# Patient Record
Sex: Female | Born: 1990 | Race: White | Hispanic: No | Marital: Married | Smoking: Former smoker
Health system: Southern US, Community
[De-identification: ages and names within clinical notes are randomized; demographics above are authoritative.]

## PROBLEM LIST (undated history)

## (undated) DIAGNOSIS — G43909 Migraine, unspecified, not intractable, without status migrainosus: Secondary | ICD-10-CM

## (undated) HISTORY — DX: Migraine, unspecified, not intractable, without status migrainosus: G43.909

## (undated) HISTORY — PX: NO PAST SURGERIES: SHX2092

---
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
## 2011-02-15 LAB — GLUCOSE, CAPILLARY
Glucose-Capillary: 106 mg/dL — ABNORMAL HIGH (ref 70–99)
Glucose-Capillary: 117 mg/dL — ABNORMAL HIGH (ref 70–99)
Glucose-Capillary: 134 mg/dL — ABNORMAL HIGH (ref 70–99)
Glucose-Capillary: 138 mg/dL — ABNORMAL HIGH (ref 70–99)
Glucose-Capillary: 139 mg/dL — ABNORMAL HIGH (ref 70–99)
Glucose-Capillary: 140 mg/dL — ABNORMAL HIGH (ref 70–99)
Glucose-Capillary: 163 mg/dL — ABNORMAL HIGH (ref 70–99)
Glucose-Capillary: 89 mg/dL (ref 70–99)

## 2011-02-15 LAB — BASIC METABOLIC PANEL
Calcium: 8.3 mg/dL — ABNORMAL LOW (ref 8.4–10.5)
Creatinine, Ser: 0.73 mg/dL (ref 0.4–1.2)
GFR calc Af Amer: >60 mL/min (ref >60)
Potassium: 3.8 mEq/L (ref 3.5–5.1)

## 2011-02-15 LAB — URINE CULTURE

## 2011-02-15 LAB — DIFFERENTIAL
Basophils Absolute: 0 10*3/uL (ref 0.0–0.1)
Basophils Relative: 0 % (ref 0–1)
Eosinophils Relative: 0 % (ref 0–5)
Lymphs Abs: 0.9 10*3/uL (ref 0.7–4.0)
Neutro Abs: 20.3 10*3/uL — ABNORMAL HIGH (ref 1.7–7.7)
Neutrophils Relative %: 88 % — ABNORMAL HIGH (ref 43–77)
WBC Morphology: INCREASED

## 2011-02-15 LAB — CBC
HCT: 31.7 % — ABNORMAL LOW (ref 36.0–46.0)
HCT: 35.9 % — ABNORMAL LOW (ref 36.0–46.0)
Hemoglobin: 10.8 g/dL — ABNORMAL LOW (ref 12.0–15.0)
MCH: 30 pg (ref 26.0–34.0)
MCH: 30 pg (ref 26.0–34.0)
MCHC: 34.1 g/dL (ref 30.0–36.0)
MCHC: 34.6 g/dL (ref 30.0–36.0)
MCV: 86.7 fL (ref 78.0–100.0)
MCV: 87.5 fL (ref 78.0–100.0)
MCV: 87.9 fL (ref 78.0–100.0)
Platelets: 156 10*3/uL (ref 150–400)
Platelets: 186 10*3/uL (ref 150–400)
RBC: 4.14 MIL/uL (ref 3.87–5.11)
RDW: 13.4 % (ref 11.5–15.5)
RDW: 13.7 % (ref 11.5–15.5)
WBC: 13 10*3/uL — ABNORMAL HIGH (ref 4.0–10.5)
WBC: 7.4 10*3/uL (ref 4.0–10.5)

## 2011-02-15 LAB — LACTIC ACID, PLASMA: Lactic Acid, Venous: 1.7 mmol/L (ref 0.5–2.2)

## 2011-02-15 LAB — URINE MICROSCOPIC-ADD ON

## 2011-02-15 LAB — URINALYSIS, ROUTINE W REFLEX MICROSCOPIC
Glucose, UA: >1000 mg/dL — AB
Ketones, ur: NEGATIVE mg/dL
Leukocytes, UA: NEGATIVE
Specific Gravity, Urine: 1.036 — ABNORMAL HIGH (ref 1.005–1.030)
pH: 6.5 (ref 5.0–8.0)

## 2011-02-15 LAB — COMPREHENSIVE METABOLIC PANEL
Albumin: 3.2 g/dL — ABNORMAL LOW (ref 3.5–5.2)
BUN: 6 mg/dL (ref 6–23)
Calcium: 9 mg/dL (ref 8.4–10.5)
GFR calc non Af Amer: >60 mL/min (ref >60)
Glucose, Bld: 147 mg/dL — ABNORMAL HIGH (ref 70–99)
Potassium: 3.7 mEq/L (ref 3.5–5.1)
Sodium: 132 mEq/L — ABNORMAL LOW (ref 135–145)
Total Bilirubin: 0.6 mg/dL (ref 0.3–1.2)
Total Protein: 6.8 g/dL (ref 6.0–8.3)

## 2011-02-15 LAB — CULTURE, BLOOD (ROUTINE X 2): Culture: NO GROWTH

## 2011-02-15 LAB — PROCALCITONIN: Procalcitonin: 1.84 ng/mL

## 2012-02-18 ENCOUNTER — Emergency Department (HOSPITAL_COMMUNITY)
Admission: EM | Admit: 2012-02-18 | Discharge: 2012-02-18 | Disposition: A | Discharge status: Home / Self Care | Payer: Self-pay | Attending: Emergency Medicine | Admitting: Emergency Medicine

## 2012-02-18 ENCOUNTER — Encounter (HOSPITAL_COMMUNITY): Payer: Self-pay | Admitting: Emergency Medicine

## 2012-02-18 DIAGNOSIS — Z0389 Encounter for observation for other suspected diseases and conditions ruled out: Secondary | ICD-10-CM | POA: Insufficient documentation

## 2012-02-18 LAB — CBC
HCT: 40.4 % (ref 36.0–46.0)
Hemoglobin: 13.4 g/dL (ref 12.0–15.0)
MCH: 29.5 pg (ref 26.0–34.0)
MCV: 88.8 fL (ref 78.0–100.0)
RBC: 4.55 MIL/uL (ref 3.87–5.11)
WBC: 12.1 10*3/uL — ABNORMAL HIGH (ref 4.0–10.5)

## 2012-02-18 LAB — BASIC METABOLIC PANEL
BUN: 10 mg/dL (ref 6–23)
CO2: 27 mEq/L (ref 19–32)
Calcium: 9.5 mg/dL (ref 8.4–10.5)
GFR calc non Af Amer: >90 mL/min (ref >90)
Glucose, Bld: 120 mg/dL — ABNORMAL HIGH (ref 70–99)
Sodium: 139 mEq/L (ref 135–145)

## 2012-02-18 LAB — DIFFERENTIAL
Eosinophils Absolute: 0 10*3/uL (ref 0.0–0.7)
Eosinophils Relative: 0 % (ref 0–5)
Lymphocytes Relative: 20 % (ref 12–46)
Lymphs Abs: 2.4 10*3/uL (ref 0.7–4.0)
Monocytes Absolute: 1 10*3/uL (ref 0.1–1.0)
Monocytes Relative: 8 % (ref 3–12)

## 2012-02-18 NOTE — ED Notes (Signed)
No answer, unable to find, not in w/r, h/w triage or b/r.

## 2012-02-18 NOTE — ED Notes (Signed)
PT. REPORTS DIZZINESS "SHAKY" WITH SOB WHILE AT WORK TODAY , ALSO REPORTS MOUTH FELT NUMB WHILE EATING THIS EVENING , SPEECH CLEAR / NO FACIAL ASYMMETRY , AMBULATORY.

## 2012-02-18 NOTE — ED Notes (Signed)
Pt. Called in main lobby and triage for vital signs update w/ no answer.

## 2019-01-29 ENCOUNTER — Encounter (HOSPITAL_COMMUNITY): Payer: Self-pay | Admitting: Emergency Medicine

## 2019-01-29 ENCOUNTER — Emergency Department (HOSPITAL_COMMUNITY): Payer: PRIVATE HEALTH INSURANCE

## 2019-01-29 ENCOUNTER — Emergency Department (HOSPITAL_COMMUNITY)
Admission: EM | Admit: 2019-01-29 | Discharge: 2019-01-29 | Disposition: A | Discharge status: Home / Self Care | Payer: PRIVATE HEALTH INSURANCE | Attending: Emergency Medicine | Admitting: Emergency Medicine

## 2019-01-29 ENCOUNTER — Other Ambulatory Visit: Payer: Self-pay

## 2019-01-29 DIAGNOSIS — N10 Acute tubulo-interstitial nephritis: Secondary | ICD-10-CM | POA: Diagnosis not present

## 2019-01-29 DIAGNOSIS — E860 Dehydration: Secondary | ICD-10-CM | POA: Insufficient documentation

## 2019-01-29 DIAGNOSIS — E119 Type 2 diabetes mellitus without complications: Secondary | ICD-10-CM | POA: Diagnosis not present

## 2019-01-29 DIAGNOSIS — J45909 Unspecified asthma, uncomplicated: Secondary | ICD-10-CM | POA: Insufficient documentation

## 2019-01-29 DIAGNOSIS — Z794 Long term (current) use of insulin: Secondary | ICD-10-CM | POA: Diagnosis not present

## 2019-01-29 DIAGNOSIS — R109 Unspecified abdominal pain: Secondary | ICD-10-CM | POA: Diagnosis present

## 2019-01-29 DIAGNOSIS — N12 Tubulo-interstitial nephritis, not specified as acute or chronic: Secondary | ICD-10-CM

## 2019-01-29 LAB — URINALYSIS, ROUTINE W REFLEX MICROSCOPIC
Bilirubin Urine: NEGATIVE
Glucose, UA: >=500 mg/dL — AB
HGB URINE DIPSTICK: NEGATIVE
Ketones, ur: 20 mg/dL — AB
NITRITE: NEGATIVE
Protein, ur: NEGATIVE mg/dL
Specific Gravity, Urine: 1.032 — ABNORMAL HIGH (ref 1.005–1.030)
pH: 5 (ref 5.0–8.0)

## 2019-01-29 LAB — COMPREHENSIVE METABOLIC PANEL
ALT: 15 U/L (ref 0–44)
ANION GAP: 11 (ref 5–15)
AST: 15 U/L (ref 15–41)
Albumin: 4.1 g/dL (ref 3.5–5.0)
Alkaline Phosphatase: 96 U/L (ref 38–126)
BILIRUBIN TOTAL: 1.6 mg/dL — AB (ref 0.3–1.2)
BUN: 8 mg/dL (ref 6–20)
CO2: 25 mmol/L (ref 22–32)
Calcium: 9.2 mg/dL (ref 8.9–10.3)
Chloride: 100 mmol/L (ref 98–111)
Creatinine, Ser: 0.74 mg/dL (ref 0.44–1.00)
GFR calc Af Amer: >60 mL/min (ref >60)
Glucose, Bld: 295 mg/dL — ABNORMAL HIGH (ref 70–99)
POTASSIUM: 4 mmol/L (ref 3.5–5.1)
Sodium: 136 mmol/L (ref 135–145)
TOTAL PROTEIN: 7.3 g/dL (ref 6.5–8.1)

## 2019-01-29 LAB — POC URINE PREG, ED: Preg Test, Ur: NEGATIVE

## 2019-01-29 LAB — CBC WITH DIFFERENTIAL/PLATELET
ABS IMMATURE GRANULOCYTES: 0.05 10*3/uL (ref 0.00–0.07)
Basophils Absolute: 0 10*3/uL (ref 0.0–0.1)
Basophils Relative: 0 %
Eosinophils Absolute: 0 10*3/uL (ref 0.0–0.5)
Eosinophils Relative: 0 %
HCT: 47.5 % — ABNORMAL HIGH (ref 36.0–46.0)
Hemoglobin: 15.2 g/dL — ABNORMAL HIGH (ref 12.0–15.0)
Immature Granulocytes: 0 %
Lymphocytes Relative: 9 %
Lymphs Abs: 1.1 10*3/uL (ref 0.7–4.0)
MCH: 29 pg (ref 26.0–34.0)
MCHC: 32 g/dL (ref 30.0–36.0)
MCV: 90.6 fL (ref 80.0–100.0)
Monocytes Absolute: 1 10*3/uL (ref 0.1–1.0)
Monocytes Relative: 8 %
NEUTROS ABS: 10.1 10*3/uL — AB (ref 1.7–7.7)
NEUTROS PCT: 83 %
NRBC: 0 % (ref 0.0–0.2)
Platelets: 222 10*3/uL (ref 150–400)
RBC: 5.24 MIL/uL — ABNORMAL HIGH (ref 3.87–5.11)
RDW: 11.5 % (ref 11.5–15.5)
WBC: 12.3 10*3/uL — ABNORMAL HIGH (ref 4.0–10.5)

## 2019-01-29 LAB — LIPASE, BLOOD: Lipase: 23 U/L (ref 11–51)

## 2019-01-29 MED ORDER — SODIUM CHLORIDE 0.9 % IV BOLUS
1000.0000 mL | Freq: Once | INTRAVENOUS | Status: AC
  Administered 2019-01-29: 1000 mL via INTRAVENOUS

## 2019-01-29 MED ORDER — SODIUM CHLORIDE 0.9 % IV SOLN
1.0000 g | Freq: Once | INTRAVENOUS | Status: AC
  Administered 2019-01-29: 1 g via INTRAVENOUS
  Filled 2019-01-29: qty 10

## 2019-01-29 MED ORDER — CEPHALEXIN 500 MG PO CAPS: 500.0000 mg | ORAL_CAPSULE | Freq: Two times a day (BID) | ORAL | 0 refills | Status: AC

## 2019-01-29 MED ORDER — ONDANSETRON HCL 4 MG/2ML IJ SOLN
4.0000 mg | Freq: Once | INTRAMUSCULAR | Status: AC | PRN
Start: 2019-01-29 — End: 2019-01-29
  Administered 2019-01-29: 4 mg via INTRAVENOUS
  Filled 2019-01-29: qty 2

## 2019-01-29 MED ORDER — HYDROCODONE-ACETAMINOPHEN 5-325 MG PO TABS: 1.0000 | ORAL_TABLET | ORAL | 0 refills | Status: DC | PRN

## 2019-01-29 MED ORDER — IOHEXOL 300 MG/ML  SOLN
100.0000 mL | Freq: Once | INTRAMUSCULAR | Status: AC | PRN
  Administered 2019-01-29: 100 mL via INTRAVENOUS

## 2019-01-29 MED ORDER — MORPHINE SULFATE (PF) 4 MG/ML IV SOLN
6.0000 mg | Freq: Once | INTRAVENOUS | Status: AC
  Administered 2019-01-29: 6 mg via INTRAVENOUS
  Filled 2019-01-29: qty 2

## 2019-01-29 NOTE — ED Notes (Signed)
Pt transported to CT ?

## 2019-01-29 NOTE — ED Notes (Signed)
Pt transported to US

## 2019-01-29 NOTE — ED Notes (Signed)
Pt. Aware that urine is needed, unable to provide any at this time.  

## 2019-01-29 NOTE — ED Triage Notes (Signed)
Pt reports r side 7/10 flank pain, n/v since 4am this morning. [facility] and was told her urine was negative.

## 2019-01-29 NOTE — Discharge Instructions (Addendum)
If you develop high fever, vomiting, uncontrolled pain, or any other new/concerning symptoms then return to the ER for evaluation.

## 2019-01-29 NOTE — ED Notes (Addendum)
"  Patient verbalizes understanding of discharge instructions. Opportunity for questioning and answers were provided.  pt discharged from ED ."  

## 2019-01-29 NOTE — ED Provider Notes (Signed)
[facility] EMERGENCY DEPARTMENT Provider Note   CSN: [id number] Arrival date & time: 01/29/19  1029    History   Chief Complaint Chief Complaint  Patient presents with  . Flank Pain  . Nausea  . Emesis    HPI Margaret Conner is a 28 y.o. female.     HPI  28 year old female presents with right-sided abdominal/flank pain.  Has been ongoing for a couple days but has not been that bad and seems to come and go.  Does seem somewhat food related but also positional.  Taking a deep breath makes it worse.  There is no cough or shortness of breath.  Last night/middle the morning, she noticed the pain is significantly worse.  She took ibuprofen which did not seem to help.  She had a temperature of 101.  She vomited 3 or 4 times.  No blood in her emesis.  She states she is had a little bit of urinary frequency but no dysuria or hematuria.  She went to urgent care and was told her urine looked okay.  Pain is currently rated as a 7.  Past Medical History:  Diagnosis Date  . Asthma   . Diabetes mellitus     There are no active problems to display for this patient.   History reviewed. No pertinent surgical history.   OB History   No obstetric history on file.      Home Medications    Prior to Admission medications   Medication Sig Start Date End Date Taking? Authorizing Provider  ibuprofen (ADVIL,MOTRIN) 200 MG tablet Take 600 mg by mouth every 6 (six) hours as needed for moderate pain.   Yes [provider]  Insulin Human (INSULIN PUMP) SOLN Inject 1 each into the skin continuous. Using Novolog   Yes [provider]  cephALEXin (KEFLEX) 500 MG capsule Take 1 capsule (500 mg total) by mouth 2 (two) times daily for 14 days. 01/29/19 02/12/19  Pricilla Loveless, MD  HYDROcodone-acetaminophen (NORCO) 5-325 MG tablet Take 1 tablet by mouth every 4 (four) hours as needed for severe pain. 01/29/19   Pricilla Loveless, MD    Family History No family  history on file.  Social History Social History   Tobacco Use  . Smoking status: Never Smoker  . Smokeless tobacco: Never Used  Substance Use Topics  . Alcohol use: Yes  . Drug use: No     Allergies   Penicillins   Review of Systems Review of Systems  Constitutional: Positive for fever.  Respiratory: Negative for cough and shortness of breath.   Gastrointestinal: Positive for abdominal pain, nausea and vomiting.  Genitourinary: Positive for flank pain. Negative for dysuria and hematuria.  Musculoskeletal: Positive for back pain.  All other systems reviewed and are negative.    Physical Exam Updated Vital Signs BP 127/82   Pulse 81   Temp 97.6 F (36.4 C) (Oral)   Resp 20   Ht 5\' 6"  (1.676 m)   Wt 67.1 kg   LMP 01/13/2019   SpO2 100%   BMI 23.89 kg/m   Physical Exam Vitals signs and nursing note reviewed.  Constitutional:      General: She is not in acute distress.    Appearance: She is well-developed. She is not ill-appearing or diaphoretic.  HENT:     Head: Normocephalic and atraumatic.     Right Ear: External ear normal.     Left Ear: External ear normal.     Nose: Nose normal.  Eyes:     General:        Right eye: No discharge.        Left eye: No discharge.  Cardiovascular:     Rate and Rhythm: Normal rate and regular rhythm.     Heart sounds: Normal heart sounds.  Pulmonary:     Effort: Pulmonary effort is normal.     Breath sounds: Normal breath sounds.  Abdominal:     Palpations: Abdomen is soft.     Tenderness: There is abdominal tenderness (most tender in RUQ) in the right upper quadrant, right lower quadrant and epigastric area. There is right CVA tenderness.  Skin:    General: Skin is warm and dry.  Neurological:     Mental Status: She is alert.  Psychiatric:        Mood and Affect: Mood is not anxious.      ED Treatments / Results  Labs (all labs ordered are listed, but only abnormal results are displayed) Labs Reviewed    COMPREHENSIVE METABOLIC PANEL - Abnormal; Notable for the following components:      Result Value   Glucose, Bld 295 (*)    Total Bilirubin 1.6 (*)    All other components within normal limits  CBC WITH DIFFERENTIAL/PLATELET - Abnormal; Notable for the following components:   WBC 12.3 (*)    RBC 5.24 (*)    Hemoglobin 15.2 (*)    HCT 47.5 (*)    Neutro Abs 10.1 (*)    All other components within normal limits  URINALYSIS, ROUTINE W REFLEX MICROSCOPIC - Abnormal; Notable for the following components:   APPearance CLOUDY (*)    Specific Gravity, Urine 1.032 (*)    Glucose, UA >=500 (*)    Ketones, ur 20 (*)    Leukocytes,Ua TRACE (*)    Bacteria, UA MANY (*)    All other components within normal limits  URINE CULTURE  LIPASE, BLOOD  POC URINE PREG, ED    EKG None  Radiology Ct Abdomen Pelvis W Contrast  Result Date: 01/29/2019 CLINICAL DATA:  Pt reports r side flank pain, n/v since 4am this morning. [facility] and [facility] told her [facility] was negative. EXAM: CT ABDOMEN AND PELVIS WITH CONTRAST TECHNIQUE: Multidetector CT imaging of the abdomen and pelvis was performed using the standard protocol following bolus administration of intravenous contrast. CONTRAST:  OMNIPAQUE IOHEXOL 300 MG/ML  SOLN COMPARISON:  02/10/2011 FINDINGS: Lower chest: No acute abnormality. Hepatobiliary: No focal liver abnormality is seen. No gallstones, gallbladder wall thickening, or biliary dilatation. Pancreas: Unremarkable. No pancreatic ductal dilatation or surrounding inflammatory changes. Spleen: Normal in size without focal abnormality. Adrenals/Urinary Tract: Normal adrenals Poorly marginated wedge-shaped area of decreased enhancement in the upper pole right kidney. Left kidney unremarkable. No hydronephrosis. No evident urolithiasis. Urinary bladder is incompletely distended, unremarkable. Stomach/Bowel: Stomach is nondilated. Small bowel is nondistended. Appendix not  discretely identified. No pericecal inflammatory/edematous change. Nonspecific 10mm metallic density projects in the cecum. The colon is nondilated, unremarkable. Vascular/Lymphatic: Portal vein patent. Retroaortic left renal vein, an anatomic variant. No arterial pathology identified. No abdominal or pelvic adenopathy. Reproductive: Uterus and bilateral adnexa are unremarkable. Other: No ascites. No free air. Musculoskeletal: No acute or significant osseous findings. IMPRESSION: 1. Wedge-shaped area of decreased enhancement in the upper pole right kidney suggesting focal pyelonephritis. Correlate with UA. 2. No urolithiasis or hydronephrosis. Electronically Signed   By: Corlis Leak M.D.   On: 01/29/2019 13:27  [place] Abdomen Limited Ruq  Result Date: 01/29/2019 CLINICAL DATA:  Right upper quadrant pain for 2 days. EXAM: ULTRASOUND ABDOMEN LIMITED RIGHT UPPER QUADRANT COMPARISON:  None. FINDINGS: Gallbladder: No gallstones or wall thickening visualized. No sonographic Murphy sign noted by sonographer. Common bile duct: Diameter: 2 mm, within normal limits. Liver: No focal lesion identified. Within normal limits in parenchymal echogenicity. Portal vein is patent on color Doppler imaging with normal direction of blood flow towards the liver. IMPRESSION: Negative.  No hepatobiliary abnormality identified. Electronically Signed   By: Myles RosenthalJohn  Stahl M.D.   On: 01/29/2019 11:52    Procedures Procedures (including critical care time)  Medications Ordered in ED Medications  sodium chloride 0.9 % bolus 1,000 mL (0 mLs Intravenous Stopped 01/29/19 1238)  morphine 4 MG/ML injection 6 mg (6 mg Intravenous Given 01/29/19 1100)  ondansetron (ZOFRAN) injection 4 mg (4 mg Intravenous Given 01/29/19 1100)  morphine 4 MG/ML injection 6 mg (6 mg Intravenous Given 01/29/19 1316)  iohexol (OMNIPAQUE) 300 MG/ML solution 100 mL (100 mLs Intravenous Contrast Given 01/29/19 1257)  cefTRIAXone (ROCEPHIN) 1 g in sodium chloride 0.9 % 100 mL  IVPB (1 g Intravenous New Bag/Given 01/29/19 1419)     Initial Impression / Assessment and Plan / ED Course  I have reviewed the triage vital signs and the nursing notes.  Pertinent labs & imaging results that were available during my care of the patient were reviewed by me and considered in my medical decision making (see chart for details).        Given the right upper quadrant/right flank pain, right upper quadrant ultrasound obtained but does not show gallbladder pathology.  Thus CT obtained to help rule out appendicitis.  They cannot see her appendix but there is no obvious inflammatory changes in that area.  However there is findings concerning for pyelonephritis.  She does have some urinary urgency and I think this is the most likely cause.  I did discuss with patient and mom the limitations of the CT and we discussed return precautions.  She was given dose of IV Rocephin.  She is never had a cephalosporin before but I think is reasonable to trial it and given no complications currently she can be discharged home with Keflex.  We discussed return precautions.  She otherwise appears stable.  Final Clinical Impressions(s) / ED Diagnoses   Final diagnoses:  Right sided abdominal pain  Pyelonephritis    ED Discharge Orders         Ordered    cephALEXin (KEFLEX) 500 MG capsule  2 times daily     01/29/19 1529    HYDROcodone-acetaminophen (NORCO) 5-325 MG tablet  Every 4 hours PRN     01/29/19 1529           Pricilla LovelessGoldston, Ilaisaane Marts, MD 01/29/19 1541

## 2019-01-31 LAB — URINE CULTURE

## 2019-02-01 ENCOUNTER — Telehealth: Payer: Self-pay | Admitting: *Deleted

## 2019-02-01 ENCOUNTER — Ambulatory Visit: Payer: PRIVATE HEALTH INSURANCE | Attending: Family Medicine | Admitting: Family Medicine

## 2019-02-01 ENCOUNTER — Encounter: Payer: Self-pay | Admitting: Family Medicine

## 2019-02-01 VITALS — BP 146/90 | HR 77 | Temp 98.1°F | Wt 149.0 lb

## 2019-02-01 DIAGNOSIS — N12 Tubulo-interstitial nephritis, not specified as acute or chronic: Secondary | ICD-10-CM

## 2019-02-01 DIAGNOSIS — R030 Elevated blood-pressure reading, without diagnosis of hypertension: Secondary | ICD-10-CM | POA: Diagnosis not present

## 2019-02-01 DIAGNOSIS — E1065 Type 1 diabetes mellitus with hyperglycemia: Secondary | ICD-10-CM

## 2019-02-01 DIAGNOSIS — E109 Type 1 diabetes mellitus without complications: Secondary | ICD-10-CM | POA: Insufficient documentation

## 2019-02-01 LAB — POCT GLYCOSYLATED HEMOGLOBIN (HGB A1C): HbA1c, POC (controlled diabetic range): 9.6 % — AB (ref 0.0–7.0)

## 2019-02-01 LAB — GLUCOSE, POCT (MANUAL RESULT ENTRY): POC Glucose: 304 mg/dl — AB (ref 70–99)

## 2019-02-01 NOTE — Progress Notes (Signed)
Subjective:  Patient ID: Margaret Conner, female    DOB: 03/23/1991  Age: 28 y.o. MRN: [id number]  CC: Hospitalization Follow-up and Abdominal Pain   HPI Margaret Conner is a 28 year old female with a history of type 1 diabetes mellitus (not under the care of a clinician for the last 2 years) who presents today to establish care. She had an ED visit 2 days ago where she was diagnosed with pyelonephritis.  She was treated with antiemetics, morphine, IV ceftriaxone and IV fluids in the ED. CT abdomen and pelvis with contrast revealed: IMPRESSION: 1. Wedge-shaped area of decreased enhancement in the upper pole right kidney suggesting focal pyelonephritis. Correlate with UA. 2. No urolithiasis or hydronephrosis. Urine culture was positive for Klebsiella pneumonia sensitive to Keflex which she is currently taking.  Her right flank pain has improved but she does have persisting abdominal pain on the right side which prevents her from standing at her job.  She works as a Research scientist (life sciences) and is requiring a note to be excused for today.  Denies fever, nausea or vomiting, dysuria.  With regards to her diabetes mellitus she has been obtaining Novolin R over-the-counter at [facility] for her insulin pump and has not been to see her endocrinologist in 2 years. Her blood pressure is elevated and she has no known history of hypertension.  Past Medical History:  Diagnosis Date  . Asthma   . Diabetes mellitus     History reviewed. No pertinent surgical history.  History reviewed. No pertinent family history.  Allergies  Allergen Reactions  . Penicillins Swelling    Did it involve swelling of the face/tongue/throat, SOB, or low BP? Yes Did it involve sudden or severe rash/hives, skin peeling, or any reaction on the inside of your mouth or nose? Yes Did you need to seek medical attention at a hospital or doctor's office? Yes When did it last happen?23 years ago If all above answers are  "NO", may proceed with cephalosporin use.    Outpatient Medications Prior to Visit  Medication Sig Dispense Refill  . cephALEXin (KEFLEX) 500 MG capsule Take 1 capsule (500 mg total) by mouth 2 (two) times daily for 14 days. 28 capsule 0  . HYDROcodone-acetaminophen (NORCO) 5-325 MG tablet Take 1 tablet by mouth every 4 (four) hours as needed for severe pain. 15 tablet 0  . Insulin Human (INSULIN PUMP) SOLN Inject 1 each into the skin continuous. Using Novolog    . ibuprofen (ADVIL,MOTRIN) 200 MG tablet Take 600 mg by mouth every 6 (six) hours as needed for moderate pain.     No facility-administered medications prior to visit.      ROS Review of Systems  Constitutional: Negative for activity change, appetite change and fatigue.  HENT: Negative for congestion, sinus pressure and sore throat.   Eyes: Negative for visual disturbance.  Respiratory: Negative for cough, chest tightness, shortness of breath and wheezing.   Cardiovascular: Negative for chest pain and palpitations.  Gastrointestinal: Positive for abdominal pain. Negative for abdominal distention and constipation.  Endocrine: Negative for polydipsia.  Genitourinary: Negative for dysuria and frequency.  Musculoskeletal: Negative for arthralgias and back pain.  Skin: Negative for rash.  Neurological: Negative for tremors, light-headedness and numbness.  Hematological: Does not bruise/bleed easily.  Psychiatric/Behavioral: Negative for agitation and behavioral problems.    Objective:  BP (!) 146/90   Pulse 77   Temp 98.1 F (36.7 C) (Oral)   Wt 149 lb (67.6 kg)   LMP 01/13/2019  SpO2 98%   BMI 24.05 kg/m   BP/Weight 02/01/2019 01/29/2019 02/18/2012  Systolic BP 146 127 120  Diastolic BP 90 82 72  Wt. (Lbs) 149 148 -  BMI 24.05 23.89 -      Physical Exam Constitutional:      Appearance: She is well-developed.  Cardiovascular:     Rate and Rhythm: Normal rate.     Heart sounds: Normal heart sounds. No murmur.    Pulmonary:     Effort: Pulmonary effort is normal.     Breath sounds: Normal breath sounds. No wheezing or rales.  Chest:     Chest wall: No tenderness.  Abdominal:     General: Bowel sounds are normal. There is no distension.     Palpations: Abdomen is soft. There is no mass.     Tenderness: There is no abdominal tenderness.     Comments: Right-sided abdominal TTP  Musculoskeletal: Normal range of motion.  Neurological:     Mental Status: She is alert and oriented to person, place, and time.     CMP Latest Ref Rng & Units 01/29/2019 02/18/2012 05/23/2010  Glucose 70 - 99 mg/dL 401(U) 272(Z) 366(Y)  BUN 6 - 20 mg/dL 8 10 6   Creatinine 0.44 - 1.00 mg/dL 4.03 4.74 2.59  Sodium 135 - 145 mmol/L 136 139 134(L)  Potassium 3.5 - 5.1 mmol/L 4.0 3.2(L) 3.8  Chloride 98 - 111 mmol/L 100 100 101  CO2 22 - 32 mmol/L 25 27 24   Calcium 8.9 - 10.3 mg/dL 9.2 9.5 5.6(L)  Total Protein 6.5 - 8.1 g/dL 7.3 - -  Total Bilirubin 0.3 - 1.2 mg/dL 8.7(F) - -  Alkaline Phos 38 - 126 U/L 96 - -  AST 15 - 41 U/L 15 - -  ALT 0 - 44 U/L 15 - -    Lipid Panel  No results found for: CHOL, TRIG, HDL, CHOLHDL, VLDL, LDLCALC, LDLDIRECT  CBC    Component Value Date/Time   WBC 12.3 (H) 01/29/2019 1041   RBC 5.24 (H) 01/29/2019 1041   HGB 15.2 (H) 01/29/2019 1041   HCT 47.5 (H) 01/29/2019 1041   PLT 222 01/29/2019 1041   MCV 90.6 01/29/2019 1041   MCH 29.0 01/29/2019 1041   MCHC 32.0 01/29/2019 1041   RDW 11.5 01/29/2019 1041   LYMPHSABS 1.1 01/29/2019 1041   MONOABS 1.0 01/29/2019 1041   EOSABS 0.0 01/29/2019 1041   BASOSABS 0.0 01/29/2019 1041    Lab Results  Component Value Date   HGBA1C 9.6 (A) 02/01/2019    Assessment & Plan:   1. Type 1 diabetes mellitus with hyperglycemia (HCC) Uncontrolled with A1c of 9.6 She has not been under medical care for the last 2 years Refer to endocrine Diabetic diet, lifestyle modifications - POCT glucose (manual entry) - POCT glycosylated hemoglobin (Hb  A1C) - Ambulatory referral to Endocrinology  2. Pyelonephritis Currently on Keflex; received ceftriaxone in the ED Symptoms are improving-advised she will need to allow some time for total recovery and improvement especially given abdominal pain. Increase fluid intake Discussed alarming signs including nausea, vomiting, worsening fever, worsening abdominal pain and she knows to reach out to the clinic or go to the ED if this develops.  3. Elevated blood pressure reading No known history of hypertension Will reassess blood pressure at next visit   No orders of the defined types were placed in this encounter.   Follow-up: Return in about 1 month (around 03/04/2019) for Follow-up on elevated blood pressure.  Hoy Register, MD, FAAFP. [facility] [place], [place] [phone]   02/01/2019, 5:30 PM

## 2019-02-01 NOTE — Telephone Encounter (Signed)
Post ED Visit - Positive Culture Follow-up  Culture report reviewed by antimicrobial stewardship pharmacist: [facility] []  Enzo Bi, Pharm.D. []  Celedonio Miyamoto, Pharm.D., BCPS AQ-ID []  Garvin Fila, Pharm.D., BCPS []  Georgina Pillion, Pharm.D., BCPS []  [place], [address].D., BCPS, AAHIVP []  Estella Husk, Pharm.D., BCPS, AAHIVP []  [name], PharmD, BCPS []  Phillips Climes, PharmD, BCPS []  Agapito Games, PharmD, BCPS []  Verlan Friends, PharmD []  Mervyn Gay, PharmD, BCPS []  Vinnie Level, PharmD Wendelyn Breslow, PharmD  [facility] []  Len Childs, PharmD []  Greer Pickerel, PharmD []  Adalberto Cole, PharmD []  Perlie Gold, Rph []  Lonell Face) Jean Rosenthal, PharmD []  Earl Many, PharmD []  Junita Push, PharmD []  Dorna Leitz, PharmD []  Terrilee Files, PharmD []  Lynann Beaver, PharmD []  Keturah Barre, PharmD []  Loralee Pacas, PharmD []  Bernadene Person, PharmD   Positive urine culture Treated with Cephalexin, organism sensitive to the same and no further patient follow-up is required at this time.  [facility]irl [facility] 02/01/2019, 9:00 AM

## 2019-02-01 NOTE — Progress Notes (Signed)
C/C: Abdominal pain upper right side that goes around to back.  [id number] A1C-9.6

## 2019-03-03 ENCOUNTER — Encounter: Payer: Self-pay | Admitting: Internal Medicine

## 2019-03-23 ENCOUNTER — Ambulatory Visit: Payer: PRIVATE HEALTH INSURANCE | Admitting: Family Medicine

## 2019-05-24 ENCOUNTER — Inpatient Hospital Stay (HOSPITAL_COMMUNITY)
Admission: EM | Admit: 2019-05-24 | Discharge: 2019-05-26 | DRG: 690 | Disposition: A | Discharge status: Home / Self Care | Payer: PRIVATE HEALTH INSURANCE | Attending: Internal Medicine | Admitting: Internal Medicine

## 2019-05-24 ENCOUNTER — Encounter (HOSPITAL_COMMUNITY): Payer: Self-pay | Admitting: Internal Medicine

## 2019-05-24 ENCOUNTER — Emergency Department (HOSPITAL_COMMUNITY): Payer: PRIVATE HEALTH INSURANCE

## 2019-05-24 DIAGNOSIS — N10 Acute tubulo-interstitial nephritis: Principal | ICD-10-CM | POA: Diagnosis present

## 2019-05-24 DIAGNOSIS — E1065 Type 1 diabetes mellitus with hyperglycemia: Secondary | ICD-10-CM | POA: Diagnosis not present

## 2019-05-24 DIAGNOSIS — N12 Tubulo-interstitial nephritis, not specified as acute or chronic: Secondary | ICD-10-CM | POA: Diagnosis present

## 2019-05-24 DIAGNOSIS — Z880 Allergy status to penicillin: Secondary | ICD-10-CM

## 2019-05-24 DIAGNOSIS — Z881 Allergy status to other antibiotic agents status: Secondary | ICD-10-CM

## 2019-05-24 DIAGNOSIS — Z794 Long term (current) use of insulin: Secondary | ICD-10-CM

## 2019-05-24 DIAGNOSIS — R109 Unspecified abdominal pain: Secondary | ICD-10-CM | POA: Diagnosis not present

## 2019-05-24 DIAGNOSIS — Z9641 Presence of insulin pump (external) (internal): Secondary | ICD-10-CM | POA: Diagnosis present

## 2019-05-24 DIAGNOSIS — E109 Type 1 diabetes mellitus without complications: Secondary | ICD-10-CM | POA: Diagnosis present

## 2019-05-24 DIAGNOSIS — J45909 Unspecified asthma, uncomplicated: Secondary | ICD-10-CM | POA: Diagnosis present

## 2019-05-24 DIAGNOSIS — Z1159 Encounter for screening for other viral diseases: Secondary | ICD-10-CM

## 2019-05-24 LAB — SARS CORONAVIRUS 2 BY RT PCR (HOSPITAL ORDER, PERFORMED IN ~~LOC~~ HOSPITAL LAB): SARS Coronavirus 2: NEGATIVE

## 2019-05-24 LAB — POC URINE PREG, ED: Preg Test, Ur: NEGATIVE

## 2019-05-24 LAB — CBC
HCT: 42.8 % (ref 36.0–46.0)
Hemoglobin: 14.3 g/dL (ref 12.0–15.0)
MCH: 30.1 pg (ref 26.0–34.0)
MCHC: 33.4 g/dL (ref 30.0–36.0)
MCV: 90.1 fL (ref 80.0–100.0)
Platelets: 184 10*3/uL (ref 150–400)
RBC: 4.75 MIL/uL (ref 3.87–5.11)
RDW: 11.7 % (ref 11.5–15.5)
WBC: 9.2 10*3/uL (ref 4.0–10.5)
nRBC: 0 % (ref 0.0–0.2)

## 2019-05-24 LAB — URINALYSIS, ROUTINE W REFLEX MICROSCOPIC
Bilirubin Urine: NEGATIVE
Glucose, UA: NEGATIVE mg/dL
Hgb urine dipstick: NEGATIVE
Ketones, ur: 5 mg/dL — AB
Nitrite: NEGATIVE
Protein, ur: 30 mg/dL — AB
Specific Gravity, Urine: 1.014 (ref 1.005–1.030)
pH: 7 (ref 5.0–8.0)

## 2019-05-24 LAB — COMPREHENSIVE METABOLIC PANEL
ALT: 15 U/L (ref 0–44)
AST: 17 U/L (ref 15–41)
Albumin: 3.9 g/dL (ref 3.5–5.0)
Alkaline Phosphatase: 63 U/L (ref 38–126)
Anion gap: 8 (ref 5–15)
BUN: 6 mg/dL (ref 6–20)
CO2: 26 mmol/L (ref 22–32)
Calcium: 9.2 mg/dL (ref 8.9–10.3)
Chloride: 103 mmol/L (ref 98–111)
Creatinine, Ser: 0.74 mg/dL (ref 0.44–1.00)
GFR calc Af Amer: >60 mL/min (ref >60)
GFR calc non Af Amer: >60 mL/min (ref >60)
Glucose, Bld: 177 mg/dL — ABNORMAL HIGH (ref 70–99)
Potassium: 3.8 mmol/L (ref 3.5–5.1)
Sodium: 137 mmol/L (ref 135–145)
Total Bilirubin: 0.9 mg/dL (ref 0.3–1.2)
Total Protein: 6.8 g/dL (ref 6.5–8.1)

## 2019-05-24 MED ORDER — CIPROFLOXACIN IN D5W 400 MG/200ML IV SOLN
400.0000 mg | Freq: Once | INTRAVENOUS | Status: AC
  Administered 2019-05-24: 400 mg via INTRAVENOUS
  Filled 2019-05-24: qty 200

## 2019-05-24 MED ORDER — ONDANSETRON HCL 4 MG/2ML IJ SOLN
4.0000 mg | Freq: Once | INTRAMUSCULAR | Status: AC
  Administered 2019-05-24: 4 mg via INTRAVENOUS
  Filled 2019-05-24: qty 2

## 2019-05-24 MED ORDER — ONDANSETRON HCL 4 MG/2ML IJ SOLN: 4.0000 mg | Freq: Four times a day (QID) | INTRAMUSCULAR | Status: DC | PRN

## 2019-05-24 MED ORDER — ONDANSETRON HCL 4 MG PO TABS: 4.0000 mg | ORAL_TABLET | Freq: Four times a day (QID) | ORAL | Status: DC | PRN

## 2019-05-24 MED ORDER — IOHEXOL 300 MG/ML  SOLN
100.0000 mL | Freq: Once | INTRAMUSCULAR | Status: AC | PRN
  Administered 2019-05-24: 100 mL via INTRAVENOUS

## 2019-05-24 MED ORDER — HYDROMORPHONE HCL 1 MG/ML IJ SOLN
0.2500 mg | INTRAMUSCULAR | Status: DC | PRN
  Administered 2019-05-25 (×3): 0.25 mg via INTRAVENOUS
  Filled 2019-05-24 (×3): qty 0.5

## 2019-05-24 MED ORDER — SODIUM CHLORIDE 0.9 % IV SOLN
INTRAVENOUS | Status: AC
  Administered 2019-05-25 (×4): via INTRAVENOUS

## 2019-05-24 MED ORDER — ACETAMINOPHEN 650 MG RE SUPP: 650.0000 mg | Freq: Four times a day (QID) | RECTAL | Status: DC | PRN

## 2019-05-24 MED ORDER — MORPHINE SULFATE (PF) 4 MG/ML IV SOLN
4.0000 mg | Freq: Once | INTRAVENOUS | Status: AC
  Administered 2019-05-24: 4 mg via INTRAVENOUS
  Filled 2019-05-24: qty 1

## 2019-05-24 MED ORDER — ACETAMINOPHEN 325 MG PO TABS: 650.0000 mg | ORAL_TABLET | Freq: Four times a day (QID) | ORAL | Status: DC | PRN

## 2019-05-24 MED ORDER — ONDANSETRON HCL 4 MG/2ML IJ SOLN: 4.0000 mg | Freq: Once | INTRAMUSCULAR | Status: DC

## 2019-05-24 MED ORDER — ENOXAPARIN SODIUM 40 MG/0.4ML ~~LOC~~ SOLN
40.0000 mg | SUBCUTANEOUS | Status: DC
  Administered 2019-05-25: 40 mg via SUBCUTANEOUS
  Filled 2019-05-24: qty 0.4

## 2019-05-24 MED ORDER — SODIUM CHLORIDE 0.9 % IV BOLUS
1000.0000 mL | Freq: Once | INTRAVENOUS | Status: AC
  Administered 2019-05-24: 1000 mL via INTRAVENOUS

## 2019-05-24 MED ORDER — INSULIN PUMP
SUBCUTANEOUS | Status: DC
  Administered 2019-05-25: 12.8 via SUBCUTANEOUS
  Administered 2019-05-25: 1.1 via SUBCUTANEOUS
  Administered 2019-05-25: 7.5 via SUBCUTANEOUS
  Administered 2019-05-25 (×2): via SUBCUTANEOUS
  Administered 2019-05-26: 2.3 via SUBCUTANEOUS
  Administered 2019-05-26: 6.7 via SUBCUTANEOUS
  Filled 2019-05-24: qty 1

## 2019-05-24 NOTE — ED Provider Notes (Signed)
[facility] Provider Note   CSN: [id number] Arrival date & time: 05/24/19  1449  History   Chief Complaint Chief Complaint  Patient presents with   Urinary Tract Infection   Back Pain   HPI Margaret Conner is a 28 y.o. female with past medical history significant for pyelonephritis who presents for evaluation of dysuria and flank pain.  States she has had left flank pain which began yesterday evening.  She is also had dysuria which feels similar to her previous history of pyelonephritis.  He was admitted to the hospital approximately 9 years ago for 2 weeks for pyelonephritis which required PICC line.  Patient states she felt warm this morning her temperature was 100.0.  Been able to tolerate p.o. intake without difficulty at home.  Has had persistent nausea.  Denies chest pain, shortness of breath, diarrhea, constipation.  Has had some mild suprapubic tenderness to palpation.  Denies previous history of nephrolithiasis or renal abscesses.  Has not taken anything for her pain or her fever PTA.  Rates her current pain a 7/10.  Pain located to the left flank.  Pain constant since onset. Denies any additional aggravating or alleviating factors.  History obtained from patient and past medical records.  No interpreter is used.  PCP- [facility].     HPI  Past Medical History:  Diagnosis Date   Asthma    Diabetes mellitus     Patient Active Problem List   Diagnosis Date Noted   Pyelonephritis 05/24/2019   Diabetes mellitus 02/01/2019   Type 1 diabetes (HCC) 02/01/2019    History reviewed. No pertinent surgical history.   OB History   No obstetric history on file.      Home Medications    Prior to Admission medications   Medication Sig Start Date End Date Taking? Authorizing Provider  ibuprofen (ADVIL,MOTRIN) 200 MG tablet Take 600 mg by mouth every 6 (six) hours as needed (for headaches or  pain).    Yes [provider]  Insulin Human (INSULIN PUMP) SOLN Inject 1 each into the skin continuous. Pump uses Novolog   Yes [provider]  HYDROcodone-acetaminophen (NORCO) 5-325 MG tablet Take 1 tablet by mouth every 4 (four) hours as needed for severe pain. Patient not taking: Reported on 05/24/2019 01/29/19   Pricilla LovelessGoldston, Scott, MD    Family History Family History  Problem Relation Age of Onset   Diabetes Mellitus II Neg Hx     Social History Social History   Tobacco Use   Smoking status: Never Smoker   Smokeless tobacco: Never Used  Substance Use Topics   Alcohol use: Yes   Drug use: No     Allergies   Penicillins and Cephalexin   Review of Systems Review of Systems  Constitutional: Negative.   HENT: Negative.   Eyes: Negative.   Respiratory: Negative.   Cardiovascular: Negative.   Gastrointestinal: Positive for abdominal pain and nausea. Negative for abdominal distention, anal bleeding, blood in stool, constipation, diarrhea, rectal pain and vomiting.  Genitourinary: Positive for dysuria and flank pain. Negative for decreased urine volume, difficulty urinating, dyspareunia, enuresis, frequency, genital sores, hematuria, menstrual problem, pelvic pain, urgency, vaginal bleeding, vaginal discharge and vaginal pain.  Skin: Negative.   Neurological: Negative.   All other systems reviewed and are negative.    Physical Exam Updated Vital Signs BP 135/86    Pulse 80    Temp 100.1 F (37.8 C) (Oral)  Resp 20    SpO2 100%   Physical Exam Vitals signs and nursing note reviewed.  Constitutional:      General: She is not in acute distress.    Appearance: She is well-developed. She is not ill-appearing, toxic-appearing or diaphoretic.  HENT:     Head: Normocephalic and atraumatic.     Nose: Nose normal.     Mouth/Throat:     Mouth: Mucous membranes are moist.     Pharynx: Oropharynx is clear.  Eyes:     Pupils: Pupils are equal, round, and  reactive to light.  Neck:     Musculoskeletal: Normal range of motion and neck supple.  Cardiovascular:     Rate and Rhythm: Normal rate and regular rhythm.     Pulses: Normal pulses.     Heart sounds: Normal heart sounds. No murmur. No friction rub. No gallop.   Pulmonary:     Effort: Pulmonary effort is normal. No respiratory distress.     Breath sounds: Normal breath sounds. No stridor. No wheezing, rhonchi or rales.  Chest:     Chest wall: No tenderness.  Abdominal:     General: There is no distension.     Palpations: Abdomen is soft.     Tenderness: There is abdominal tenderness in the suprapubic area. There is left CVA tenderness. There is no right CVA tenderness, guarding or rebound.     Hernia: No hernia is present.     Comments: Soft without rebound or guarding.  Suprapubic tenderness palpation.  Left positive CVA tap.  Normoactive bowel sounds.  No abdominal wall herniations.  Musculoskeletal: Normal range of motion.  Skin:    General: Skin is warm and dry.  Neurological:     Mental Status: She is alert.    ED Treatments / Results  Labs (all labs ordered are listed, but only abnormal results are displayed) Labs Reviewed  COMPREHENSIVE METABOLIC PANEL - Abnormal; Notable for the following components:      Result Value   Glucose, Bld 177 (*)    All other components within normal limits  URINALYSIS, ROUTINE W REFLEX MICROSCOPIC - Abnormal; Notable for the following components:   Ketones, ur 5 (*)    Protein, ur 30 (*)    Leukocytes,Ua MODERATE (*)    Bacteria, UA FEW (*)    All other components within normal limits  SARS CORONAVIRUS 2 (HOSPITAL ORDER, [facility])  URINE CULTURE  CBC  HIV ANTIBODY (ROUTINE TESTING W REFLEX)  CBC  CREATININE, SERUM  COMPREHENSIVE METABOLIC PANEL  CBC WITH DIFFERENTIAL/PLATELET  POC URINE PREG, ED    EKG None  Radiology Ct Abdomen Pelvis W Contrast  Result Date: 05/24/2019 CLINICAL DATA:   28 year old female with pyelonephritis and left flank pain. EXAM: CT ABDOMEN AND PELVIS WITH CONTRAST TECHNIQUE: Multidetector CT imaging of the abdomen and pelvis was performed using the standard protocol following bolus administration of intravenous contrast. CONTRAST:  135mL OMNIPAQUE IOHEXOL 300 MG/ML  SOLN COMPARISON:  CT of the abdomen pelvis dated 01/29/2019 FINDINGS: Lower chest: The visualized lung bases are clear. No intra-abdominal free air. Small free fluid within the pelvis. Hepatobiliary: The liver is unremarkable. Probable sludge or hyper concentrated bile within the gallbladder. No calcified stone or pericholecystic fluid. Pancreas: Unremarkable. No pancreatic ductal dilatation or surrounding inflammatory changes. Spleen: Normal in size without focal abnormality. Adrenals/Urinary Tract: The adrenal glands are unremarkable. There is a 1.5 x 1.5 cm hypoenhancing focus in the lateral interpolar left renal  parenchyma with surrounding inflammatory changes. This is most consistent with a focal area of pyelonephritis or a lobar nephronia. No drainable fluid collection identified at this time. Follow-up with ultrasound after treatment is recommended to exclude abscess. The right kidney is unremarkable. There is no hydronephrosis on either side. The visualized ureters and urinary bladder appear unremarkable. Stomach/Bowel: There is no bowel obstruction or active inflammation. Normal appendix. Vascular/Lymphatic: The abdominal aorta and IVC are unremarkable. No portal venous gas. There is no adenopathy. Reproductive: The uterus is anteverted and grossly unremarkable. The ovaries appear unremarkable as well. No pelvic mass. Other: None Musculoskeletal: No acute or significant osseous findings. IMPRESSION: 1. Focal area of pyelonephritis or lobar nephronia in the lateral interpolar left kidney. No drainable fluid collection identified at this time. Follow-up with ultrasound after treatment recommended to exclude  development of an abscess. 2. No bowel obstruction or active inflammation. Normal appendix. Electronically Signed   By: Elgie CollardArash  Radparvar M.D.   On: 05/24/2019 20:15    Procedures Procedures (including critical care time)  Medications Ordered in ED Medications  HYDROmorphone (DILAUDID) injection 0.25 mg (has no administration in time range)  ondansetron (ZOFRAN) injection 4 mg (0 mg Intravenous Hold 05/24/19 2214)  insulin pump (has no administration in time range)  acetaminophen (TYLENOL) tablet 650 mg (has no administration in time range)    Or  acetaminophen (TYLENOL) suppository 650 mg (has no administration in time range)  ondansetron (ZOFRAN) tablet 4 mg (has no administration in time range)    Or  ondansetron (ZOFRAN) injection 4 mg (has no administration in time range)  enoxaparin (LOVENOX) injection 40 mg (has no administration in time range)  0.9 %  sodium chloride infusion (has no administration in time range)  sodium chloride 0.9 % bolus 1,000 mL (0 mLs Intravenous Stopped 05/24/19 2136)  ondansetron (ZOFRAN) injection 4 mg (4 mg Intravenous Given 05/24/19 1841)  morphine 4 MG/ML injection 4 mg (4 mg Intravenous Given 05/24/19 1841)  ciprofloxacin (CIPRO) IVPB 400 mg (0 mg Intravenous Stopped 05/24/19 2107)  iohexol (OMNIPAQUE) 300 MG/ML solution 100 mL (100 mLs Intravenous Contrast Given 05/24/19 1959)  morphine 4 MG/ML injection 4 mg (4 mg Intravenous Given 05/24/19 2046)  ondansetron (ZOFRAN) injection 4 mg (4 mg Intravenous Given 05/24/19 2218)   Initial Impression / Assessment and Plan / ED Course  I have reviewed the triage vital signs and the nursing notes.  Pertinent labs & imaging results that were available during my care of the patient were reviewed by me and considered in my medical decision making (see chart for details).  28 year old female appears otherwise well presents for evaluation of left flank pain and dysuria.  Low-grade temperature 100.  Nonseptic,  non-ill-appearing.  States similar symptoms with past episodes of pyelonephritis.  Tolerating p.o. intake without difficulty at home.  No history of stones.  No concerns for STDs.  Suprapubic tenderness palpation without rebound or guarding.  Positive CVA tap to left.  Heart and lungs clear.  Labs obtained from triage.  Concern for pyelonephritis vs infected stone.  CBC without leukocytosis CMP with mild hyperglycemia at 177. DM1 without evidence of DKA. Pregnancy negatuve CT AP Pyelonephritis without drainable abscess Urinalysis Positive for  UTI  Given IVF, pain meds, nausea meds, IV Abx. Pen allergic, will give Cipro  2030: Pain not well controlled. Will give additional pain meds and reevaluate. COVID testing obtained for possible admission.  CT scan with evidence of pyelonephritis without drainable abscess.  2130: Persistent nausea and pain.  Will need admission for pain control and persistent nausea in setting of pyelonephritis.  Patient does not appear septic.  2137: Consulted with Dr. Toniann FailKakrakandy with [facility] who will evaluate patient for admission.  Received IV antibiotics and fluids. Requesting renal ultrasound to assess for obstruction.  The patient appears reasonably stabilized for admission considering the current resources, flow, and capabilities available in the ED at this time, and I doubt any other [facility] requiring further screening and/or treatment in the ED prior to admission.     Final Clinical Impressions(s) / ED Diagnoses   Final diagnoses:  Pyelonephritis    ED Discharge Orders    None       Oviya Ammar A, PA-C 05/24/19 2353    Sabas SousBero, Michael M, MD 05/31/19 [phone]

## 2019-05-24 NOTE — ED Triage Notes (Signed)
Pt here with c/o left back pain painful urination with some discharge no abnormal bleeding ,with fever at home

## 2019-05-24 NOTE — H&P (Signed)
History and Physical    Margaret Conner XBJ:[id number] DOB: 06/19/1991 DOA: 05/24/2019  PCP: System, Pcp Not In  Patient coming from: Home.  Chief Complaint: Left flank pain.  HPI: Margaret Conner is a 28 y.o. female with history of diabetes mellitus type 1 presents to the ER with ongoing left flank pain.  Patient symptoms started yesterday with some mild left flank pain which started to hurt more.  Patient also started having fever chills nausea unable to keep anything.  Denies diarrhea.  Has been having some dysuria on urinating last few days.  Denies any chest pain shortness of breath productive cough headache or visual symptoms.  ED Course: In the ER patient was febrile with temperature of 100.1.  COVID-19 test was negative.  CT abdomen and pelvis done shows features consistent with left-sided pyonephritis versus lobar nephronia.  UA is consistent with UTI.  Patient is started on empiric IV antibiotics for pyelonephritis since patient has persistent nausea.  Review of Systems: As per HPI, rest all negative.   Past Medical History:  Diagnosis Date   Asthma    Diabetes mellitus     History reviewed. No pertinent surgical history.   reports that she has never smoked. She has never used smokeless tobacco. She reports current alcohol use. She reports that she does not use drugs.  Allergies  Allergen Reactions   Penicillins Anaphylaxis, Itching and Swelling    "Everything swells and burns" Did it involve swelling of the face/tongue/throat, SOB, or low BP? Yes Did it involve sudden or severe rash/hives, skin peeling, or any reaction on the inside of your mouth or nose? Yes Did you need to seek medical attention at a hospital or doctor's office? Yes When did it last happen?23 years ago If all above answers are "NO", may proceed with cephalosporin use.   Cephalexin Swelling and Other (See Comments)    Severe abdominal cramping and discharge- "I didn't like the way it  made me feel"    Family History  Problem Relation Age of Onset   Diabetes Mellitus II Neg Hx     Prior to Admission medications   Medication Sig Start Date End Date Taking? Authorizing Provider  ibuprofen (ADVIL,MOTRIN) 200 MG tablet Take 600 mg by mouth every 6 (six) hours as needed (for headaches or pain).    Yes [provider]  Insulin Human (INSULIN PUMP) SOLN Inject 1 each into the skin continuous. Pump uses Novolog   Yes [provider]  HYDROcodone-acetaminophen (NORCO) 5-325 MG tablet Take 1 tablet by mouth every 4 (four) hours as needed for severe pain. Patient not taking: Reported on 05/24/2019 01/29/19   Pricilla LovelessGoldston, Scott, MD    Physical Exam: Vitals:   05/24/19 2130 05/24/19 2145 05/24/19 2200 05/24/19 2215  BP: 139/82 127/85 139/80 122/79  Pulse: 80 84 81 80  Resp:      Temp:      TempSrc:      SpO2: 100% 99% 98% 98%      Constitutional: Moderately built and nourished. Vitals:   05/24/19 2130 05/24/19 2145 05/24/19 2200 05/24/19 2215  BP: 139/82 127/85 139/80 122/79  Pulse: 80 84 81 80  Resp:      Temp:      TempSrc:      SpO2: 100% 99% 98% 98%   Eyes: Anicteric no pallor. ENMT: No discharge from the ears eyes nose or mouth. Neck: No mass felt.  No neck rigidity. Respiratory: No rhonchi or crepitations. Cardiovascular: S1-S2 heard. Abdomen:  Left flank tenderness present.  No guarding no rigidity no rebound tenderness. Musculoskeletal: No edema. Skin: No rash. Neurologic: Alert awake oriented to time place and person.  Moves all extremities. Psychiatric: Appears normal per normal affect.   Labs on Admission: I have personally reviewed following labs and imaging studies  CBC: Recent Labs  Lab 05/24/19 1500  WBC 9.2  HGB 14.3  HCT 42.8  MCV 90.1  PLT 184   Basic Metabolic Panel: Recent Labs  Lab 05/24/19 1500  NA 137  K 3.8  CL 103  CO2 26  GLUCOSE 177*  BUN 6  CREATININE 0.74  CALCIUM 9.2   GFR: CrCl cannot be  calculated (Unknown ideal weight.). Liver Function Tests: Recent Labs  Lab 05/24/19 1500  AST 17  ALT 15  ALKPHOS 63  BILITOT 0.9  PROT 6.8  ALBUMIN 3.9   No results for input(s): LIPASE, AMYLASE in the last 168 hours. No results for input(s): AMMONIA in the last 168 hours. Coagulation Profile: No results for input(s): INR, PROTIME in the last 168 hours. Cardiac Enzymes: No results for input(s): CKTOTAL, CKMB, CKMBINDEX, TROPONINI in the last 168 hours. BNP (last 3 results) No results for input(s): PROBNP in the last 8760 hours. HbA1C: No results for input(s): HGBA1C in the last 72 hours. CBG: No results for input(s): GLUCAP in the last 168 hours. Lipid Profile: No results for input(s): CHOL, HDL, LDLCALC, TRIG, CHOLHDL, LDLDIRECT in the last 72 hours. Thyroid Function Tests: No results for input(s): TSH, T4TOTAL, FREET4, T3FREE, THYROIDAB in the last 72 hours. Anemia Panel: No results for input(s): VITAMINB12, FOLATE, FERRITIN, TIBC, IRON, RETICCTPCT in the last 72 hours. Urine analysis:    Component Value Date/Time   COLORURINE YELLOW 05/24/2019 1838   APPEARANCEUR CLEAR 05/24/2019 1838   LABSPEC 1.014 05/24/2019 1838   PHURINE 7.0 05/24/2019 1838   GLUCOSEU NEGATIVE 05/24/2019 1838   HGBUR NEGATIVE 05/24/2019 1838   BILIRUBINUR NEGATIVE 05/24/2019 1838   KETONESUR 5 (A) 05/24/2019 1838   PROTEINUR 30 (A) 05/24/2019 1838   UROBILINOGEN 1.0 05/22/2010 1412   NITRITE NEGATIVE 05/24/2019 1838   LEUKOCYTESUR MODERATE (A) 05/24/2019 1838   Sepsis Labs: @LABRCNTIP (procalcitonin:4,lacticidven:4) ) Recent Results (from the past 240 hour(s))  SARS Coronavirus 2 (CEPHEID - Performed in [facility] lab), Hosp Order     Status: None   Collection Time: 05/24/19  8:57 PM   Specimen: Nasopharyngeal Swab  Result Value Ref Range Status   SARS Coronavirus 2 NEGATIVE NEGATIVE Final    Comment: (NOTE) If result is NEGATIVE SARS-CoV-2 target nucleic acids are NOT  DETECTED. The SARS-CoV-2 RNA is generally detectable in upper and lower  respiratory specimens during the acute phase of infection. The lowest  concentration of SARS-CoV-2 viral copies this assay can detect is 250  copies / mL. A negative result does not preclude SARS-CoV-2 infection  and should not be used as the sole basis for treatment or other  patient management decisions.  A negative result may occur with  improper specimen collection / handling, submission of specimen other  than nasopharyngeal swab, presence of viral mutation(s) within the  areas targeted by this assay, and inadequate number of viral copies  (<250 copies / mL). A negative result must be combined with clinical  observations, patient history, and epidemiological information. If result is POSITIVE SARS-CoV-2 target nucleic acids are DETECTED. The SARS-CoV-2 RNA is generally detectable in upper and lower  respiratory specimens dur ing the acute phase of infection.  Positive  results are indicative of active infection with SARS-CoV-2.  Clinical  correlation with patient history and other diagnostic information is  necessary to determine patient infection status.  Positive results do  not rule out bacterial infection or co-infection with other viruses. If result is PRESUMPTIVE POSTIVE SARS-CoV-2 nucleic acids MAY BE PRESENT.   A presumptive positive result was obtained on the submitted specimen  and confirmed on repeat testing.  While 2019 novel coronavirus  (SARS-CoV-2) nucleic acids may be present in the submitted sample  additional confirmatory testing may be necessary for epidemiological  and / or clinical management purposes  to differentiate between  SARS-CoV-2 and other Sarbecovirus currently known to infect humans.  If clinically indicated additional testing with an alternate test  methodology [phone]([id number]) is advised. The SARS-CoV-2 RNA is generally  detectable in upper and lower respiratory sp ecimens during  the acute  phase of infection. The expected result is Negative. Fact Sheet for Patients:  BoilerBrush.com.cyhttps://www.fda.gov/media/136312/download Fact Sheet for Healthcare Providers: https://pope.com/https://www.fda.gov/media/136313/download This test is not yet approved or cleared by the [place] FDA and has been authorized for detection and/or diagnosis of SARS-CoV-2 by FDA under an Emergency Use Authorization (EUA).  This EUA will remain in effect (meaning this test can be used) for the duration of the COVID-19 declaration under Section 564(b)(1) of the Act, 21 U.S.C. section 360bbb-3(b)(1), unless the authorization is terminated or revoked sooner. Performed at [facility], [address] [place], [place] [address]      Radiological Exams on Admission: Ct Abdomen Pelvis W Contrast  Result Date: 05/24/2019 CLINICAL DATA:  28 year old female with pyelonephritis and left flank pain. EXAM: CT ABDOMEN AND PELVIS WITH CONTRAST TECHNIQUE: Multidetector CT imaging of the abdomen and pelvis was performed using the standard protocol following bolus administration of intravenous contrast. CONTRAST:  100mL OMNIPAQUE IOHEXOL 300 MG/ML  SOLN COMPARISON:  CT of the abdomen pelvis dated 01/29/2019 FINDINGS: Lower chest: The visualized lung bases are clear. No intra-abdominal free air. Small free fluid within the pelvis. Hepatobiliary: The liver is unremarkable. Probable sludge or hyper concentrated bile within the gallbladder. No calcified stone or pericholecystic fluid. Pancreas: Unremarkable. No pancreatic ductal dilatation or surrounding inflammatory changes. Spleen: Normal in size without focal abnormality. Adrenals/Urinary Tract: The adrenal glands are unremarkable. There is a 1.5 x 1.5 cm hypoenhancing focus in the lateral interpolar left renal parenchyma with surrounding inflammatory changes. This is most consistent with a focal area of pyelonephritis or a lobar nephronia. No drainable fluid collection identified at  this time. Follow-up with ultrasound after treatment is recommended to exclude abscess. The right kidney is unremarkable. There is no hydronephrosis on either side. The visualized ureters and urinary bladder appear unremarkable. Stomach/Bowel: There is no bowel obstruction or active inflammation. Normal appendix. Vascular/Lymphatic: The abdominal aorta and IVC are unremarkable. No portal venous gas. There is no adenopathy. Reproductive: The uterus is anteverted and grossly unremarkable. The ovaries appear unremarkable as well. No pelvic mass. Other: None Musculoskeletal: No acute or significant osseous findings. IMPRESSION: 1. Focal area of pyelonephritis or lobar nephronia in the lateral interpolar left kidney. No drainable fluid collection identified at this time. Follow-up with ultrasound after treatment recommended to exclude development of an abscess. 2. No bowel obstruction or active inflammation. Normal appendix. Electronically Signed   By: Elgie CollardArash  Radparvar M.D.   On: 05/24/2019 20:15     Assessment/Plan Principal Problem:   Pyelonephritis Active Problems:   Type 1 diabetes (HCC)    1. Acute pyelonephritis -given  the persistent nausea unable to keep in and eating patient admitted for IV antibiotics fluids pain relief.  Follow cultures. 2. Diabetes mellitus type 1 on insulin pump which will be managed per patient.  Closely follow CBGs.   DVT prophylaxis: Lovenox. Code Status: Full code. Family Communication: Discussed with patient. Disposition Plan: Home. Consults called: None. Admission status: Observation.   Rise Patience MD [facility] Pager [phone].  If 7PM-7AM, please contact night-coverage [email or web address] Password [facility]  05/24/2019, 10:22 PM

## 2019-05-25 ENCOUNTER — Observation Stay (HOSPITAL_COMMUNITY): Payer: PRIVATE HEALTH INSURANCE

## 2019-05-25 DIAGNOSIS — N12 Tubulo-interstitial nephritis, not specified as acute or chronic: Secondary | ICD-10-CM | POA: Diagnosis not present

## 2019-05-25 DIAGNOSIS — Z881 Allergy status to other antibiotic agents status: Secondary | ICD-10-CM | POA: Diagnosis not present

## 2019-05-25 DIAGNOSIS — Z9641 Presence of insulin pump (external) (internal): Secondary | ICD-10-CM | POA: Diagnosis present

## 2019-05-25 DIAGNOSIS — J45909 Unspecified asthma, uncomplicated: Secondary | ICD-10-CM | POA: Diagnosis present

## 2019-05-25 DIAGNOSIS — Z1159 Encounter for screening for other viral diseases: Secondary | ICD-10-CM | POA: Diagnosis not present

## 2019-05-25 DIAGNOSIS — R109 Unspecified abdominal pain: Secondary | ICD-10-CM | POA: Diagnosis present

## 2019-05-25 DIAGNOSIS — Z880 Allergy status to penicillin: Secondary | ICD-10-CM | POA: Diagnosis not present

## 2019-05-25 DIAGNOSIS — N10 Acute tubulo-interstitial nephritis: Secondary | ICD-10-CM | POA: Diagnosis present

## 2019-05-25 DIAGNOSIS — R110 Nausea: Secondary | ICD-10-CM | POA: Diagnosis not present

## 2019-05-25 DIAGNOSIS — E109 Type 1 diabetes mellitus without complications: Secondary | ICD-10-CM | POA: Diagnosis present

## 2019-05-25 DIAGNOSIS — E1065 Type 1 diabetes mellitus with hyperglycemia: Secondary | ICD-10-CM | POA: Diagnosis not present

## 2019-05-25 DIAGNOSIS — Z794 Long term (current) use of insulin: Secondary | ICD-10-CM | POA: Diagnosis not present

## 2019-05-25 LAB — COMPREHENSIVE METABOLIC PANEL
ALT: 11 U/L (ref 0–44)
AST: 15 U/L (ref 15–41)
Albumin: 3.8 g/dL (ref 3.5–5.0)
Alkaline Phosphatase: 61 U/L (ref 38–126)
Anion gap: 11 (ref 5–15)
BUN: <5 mg/dL — ABNORMAL LOW (ref 6–20)
CO2: 22 mmol/L (ref 22–32)
Calcium: 8.8 mg/dL — ABNORMAL LOW (ref 8.9–10.3)
Chloride: 104 mmol/L (ref 98–111)
Creatinine, Ser: 0.72 mg/dL (ref 0.44–1.00)
GFR calc Af Amer: >60 mL/min (ref >60)
GFR calc non Af Amer: >60 mL/min (ref >60)
Glucose, Bld: 60 mg/dL — ABNORMAL LOW (ref 70–99)
Potassium: 3.7 mmol/L (ref 3.5–5.1)
Sodium: 137 mmol/L (ref 135–145)
Total Bilirubin: 0.6 mg/dL (ref 0.3–1.2)
Total Protein: 5.8 g/dL — ABNORMAL LOW (ref 6.5–8.1)

## 2019-05-25 LAB — GLUCOSE, CAPILLARY
Glucose-Capillary: 100 mg/dL — ABNORMAL HIGH (ref 70–99)
Glucose-Capillary: 114 mg/dL — ABNORMAL HIGH (ref 70–99)
Glucose-Capillary: 125 mg/dL — ABNORMAL HIGH (ref 70–99)
Glucose-Capillary: 155 mg/dL — ABNORMAL HIGH (ref 70–99)
Glucose-Capillary: 173 mg/dL — ABNORMAL HIGH (ref 70–99)
Glucose-Capillary: 204 mg/dL — ABNORMAL HIGH (ref 70–99)
Glucose-Capillary: 33 mg/dL — CL (ref 70–99)
Glucose-Capillary: 45 mg/dL — ABNORMAL LOW (ref 70–99)
Glucose-Capillary: 60 mg/dL — ABNORMAL LOW (ref 70–99)
Glucose-Capillary: 60 mg/dL — ABNORMAL LOW (ref 70–99)
Glucose-Capillary: 68 mg/dL — ABNORMAL LOW (ref 70–99)
Glucose-Capillary: 97 mg/dL (ref 70–99)

## 2019-05-25 LAB — CBC WITH DIFFERENTIAL/PLATELET
Abs Immature Granulocytes: 0.03 10*3/uL (ref 0.00–0.07)
Basophils Absolute: 0 10*3/uL (ref 0.0–0.1)
Basophils Relative: 0 %
Eosinophils Absolute: 0 10*3/uL (ref 0.0–0.5)
Eosinophils Relative: 0 %
HCT: 42.8 % (ref 36.0–46.0)
Hemoglobin: 14.2 g/dL (ref 12.0–15.0)
Immature Granulocytes: 0 %
Lymphocytes Relative: 20 %
Lymphs Abs: 1.6 10*3/uL (ref 0.7–4.0)
MCH: 29.8 pg (ref 26.0–34.0)
MCHC: 33.2 g/dL (ref 30.0–36.0)
MCV: 89.9 fL (ref 80.0–100.0)
Monocytes Absolute: 1.1 10*3/uL — ABNORMAL HIGH (ref 0.1–1.0)
Monocytes Relative: 14 %
Neutro Abs: 5.5 10*3/uL (ref 1.7–7.7)
Neutrophils Relative %: 66 %
Platelets: 157 10*3/uL (ref 150–400)
RBC: 4.76 MIL/uL (ref 3.87–5.11)
RDW: 11.7 % (ref 11.5–15.5)
WBC: 8.4 10*3/uL (ref 4.0–10.5)
nRBC: 0 % (ref 0.0–0.2)

## 2019-05-25 LAB — HIV ANTIBODY (ROUTINE TESTING W REFLEX): HIV Screen 4th Generation wRfx: NONREACTIVE

## 2019-05-25 MED ORDER — DICLOFENAC SODIUM 1 % TD GEL
2.0000 g | Freq: Four times a day (QID) | TRANSDERMAL | Status: DC
  Administered 2019-05-25 (×2): 2 g via TOPICAL
  Filled 2019-05-25 (×2): qty 100

## 2019-05-25 MED ORDER — CIPROFLOXACIN IN D5W 400 MG/200ML IV SOLN
400.0000 mg | Freq: Two times a day (BID) | INTRAVENOUS | Status: DC
  Administered 2019-05-25 – 2019-05-26 (×3): 400 mg via INTRAVENOUS
  Filled 2019-05-25 (×3): qty 200

## 2019-05-25 MED ORDER — HYDROCODONE-ACETAMINOPHEN 5-325 MG PO TABS
1.0000 | ORAL_TABLET | ORAL | Status: DC | PRN
  Administered 2019-05-25 – 2019-05-26 (×4): 1 via ORAL
  Filled 2019-05-25 (×4): qty 1

## 2019-05-25 MED ORDER — KETOROLAC TROMETHAMINE 30 MG/ML IJ SOLN
30.0000 mg | Freq: Once | INTRAMUSCULAR | Status: AC
  Administered 2019-05-25: 30 mg via INTRAVENOUS
  Filled 2019-05-25: qty 1

## 2019-05-25 NOTE — Progress Notes (Signed)
PROGRESS NOTE    Margaret Conner  WUJ:[id number] DOB: 12/16/1990 DOA: 05/24/2019 PCP: System, Pcp Not In   Brief Narrative:  HPI on 05/24/2019 by Dr. [name]  Margaret Conner is a 28 y.o. female with history of diabetes mellitus type 1 presents to the ER with ongoing left flank pain.  Patient symptoms started yesterday with some mild left flank pain which started to hurt more.  Patient also started having fever chills nausea unable to keep anything.  Denies diarrhea.  Has been having some dysuria on urinating last few days.  Denies any chest pain shortness of breath productive cough headache or visual symptoms.  Assessment & Plan   Acute pyelonephritis with intractable left flank pain -Currently afebrile, no leukocytosis -UA bacteria, 11-20 WBC, moderate leukocytes, negative nitrates -CT A/P: Focal area of pyelonephritis or lobar nephronia in the lateral interpolar left kidney. No drainable fluid collections in the file at this time.  Follow-up ultrasound after treatment recommended to exclude development of an abscess. -will obtain renal US -Blood and urine cultures pending -Continues to complain of left sided pain and has been using IV dilaudid.  -have ordered K pad and one dose of toradol -Continue cipro, pain control, IVF   Persistent nausea  -Likely secondary to the above -Continue IVF and antiemetics as needed  Diabetes mellitus, type I -Currently using an insulin pump -Continue to monitor CBG  DVT Prophylaxis Lovenox  Code Status: Full  Family Communication: None at bedside.  Disposition Plan: Currently in observation.  Patient will likely require an additional midnight given continued need for IV fluids and antiemetics as well as pain control.  Suspect home when stable.  Consultants None  Procedures  Renal ultrasound  Antibiotics   Anti-infectives (From admission, onward)   Start     Dose/Rate Route Frequency Ordered Stop   05/25/19 0800   ciprofloxacin (CIPRO) IVPB 400 mg     400 mg 200 mL/hr over 60 Minutes Intravenous Every 12 hours 05/25/19 0146     05/24/19 1945  ciprofloxacin (CIPRO) IVPB 400 mg     400 mg 200 mL/hr over 60 Minutes Intravenous  Once 05/24/19 1942 05/24/19 2107      Subjective:   Margaret Conner seen and examined today.  Continues to complain of left-sided pain which keeps her from walking and is worse with movement or deep inspiration.  Also complaining of nausea without vomiting.  Denies current chest pain, shortness of breath, dizziness or headache.  Objective:   Vitals:   05/24/19 2239 05/24/19 2303 05/24/19 2319 05/25/19 0524  BP:   135/86 114/68  Pulse: 81  80 70  Resp:    20  Temp:  98 F (36.7 C) 100.1 F (37.8 C) 98.2 F (36.8 C)  TempSrc:  Oral Oral Oral  SpO2: 100%  100% 98%    Intake/Output Summary (Last 24 hours) at 05/25/2019 0933 Last data filed at 05/25/2019 0500 Gross per 24 hour  Intake 1814.31 ml  Output --  Net 1814.31 ml   There were no vitals filed for this visit.  Exam  General: Well developed, well nourished, NAD, appears stated age  HEENT: NCAT, mucous membranes moist.   Neck: Supple  Cardiovascular: S1 S2 auscultated, RRR, no murmur  Respiratory: Clear to auscultation bilaterally  Abdomen: Soft, nontender, nondistended, + bowel sounds  Extremities: warm dry without cyanosis clubbing or edema  Neuro: AAOx3, nonfocal  Psych: Appropriate mood and affect   Data Reviewed: I have personally reviewed following labs and imaging  studies  CBC: Recent Labs  Lab 05/24/19 1500 05/25/19 0244  WBC 9.2 8.4  NEUTROABS  --  5.5  HGB 14.3 14.2  HCT 42.8 42.8  MCV 90.1 89.9  PLT 184 761   Basic Metabolic Panel: Recent Labs  Lab 05/24/19 1500 05/25/19 0244  NA 137 137  K 3.8 3.7  CL 103 104  CO2 26 22  GLUCOSE 177* 60*  BUN 6 <5*  CREATININE 0.74 0.72  CALCIUM 9.2 8.8*   GFR: CrCl cannot be calculated (Unknown ideal weight.). Liver  Function Tests: Recent Labs  Lab 05/24/19 1500 05/25/19 0244  AST 17 15  ALT 15 11  ALKPHOS 63 61  BILITOT 0.9 0.6  PROT 6.8 5.8*  ALBUMIN 3.9 3.8   No results for input(s): LIPASE, AMYLASE in the last 168 hours. No results for input(s): AMMONIA in the last 168 hours. Coagulation Profile: No results for input(s): INR, PROTIME in the last 168 hours. Cardiac Enzymes: No results for input(s): CKTOTAL, CKMB, CKMBINDEX, TROPONINI in the last 168 hours. BNP (last 3 results) No results for input(s): PROBNP in the last 8760 hours. HbA1C: No results for input(s): HGBA1C in the last 72 hours. CBG: Recent Labs  Lab 05/25/19 0019 05/25/19 0413 05/25/19 0616 05/25/19 0756  GLUCAP 60* 68* 114* 204*   Lipid Profile: No results for input(s): CHOL, HDL, LDLCALC, TRIG, CHOLHDL, LDLDIRECT in the last 72 hours. Thyroid Function Tests: No results for input(s): TSH, T4TOTAL, FREET4, T3FREE, THYROIDAB in the last 72 hours. Anemia Panel: No results for input(s): VITAMINB12, FOLATE, FERRITIN, TIBC, IRON, RETICCTPCT in the last 72 hours. Urine analysis:    Component Value Date/Time   COLORURINE YELLOW 05/24/2019 1838   APPEARANCEUR CLEAR 05/24/2019 1838   LABSPEC 1.014 05/24/2019 1838   PHURINE 7.0 05/24/2019 1838   GLUCOSEU NEGATIVE 05/24/2019 1838   HGBUR NEGATIVE 05/24/2019 1838   BILIRUBINUR NEGATIVE 05/24/2019 1838   KETONESUR 5 (A) 05/24/2019 1838   PROTEINUR 30 (A) 05/24/2019 1838   UROBILINOGEN 1.0 05/22/2010 1412   NITRITE NEGATIVE 05/24/2019 1838   LEUKOCYTESUR MODERATE (A) 05/24/2019 1838   Sepsis Labs: @LABRCNTIP (procalcitonin:4,lacticidven:4)  ) Recent Results (from the past 240 hour(s))  SARS Coronavirus 2 (CEPHEID - Performed in [place] hospital lab), Hosp Order     Status: None   Collection Time: 05/24/19  8:57 PM   Specimen: Nasopharyngeal Swab  Result Value Ref Range Status   SARS Coronavirus 2 NEGATIVE NEGATIVE Final    Comment: (NOTE) If result is  NEGATIVE SARS-CoV-2 target nucleic acids are NOT DETECTED. The SARS-CoV-2 RNA is generally detectable in upper and lower  respiratory specimens during the acute phase of infection. The lowest  concentration of SARS-CoV-2 viral copies this assay can detect is 250  copies / mL. A negative result does not preclude SARS-CoV-2 infection  and should not be used as the sole basis for treatment or other  patient management decisions.  A negative result may occur with  improper specimen collection / handling, submission of specimen other  than nasopharyngeal swab, presence of viral mutation(s) within the  areas targeted by this assay, and inadequate number of viral copies  (<250 copies / mL). A negative result must be combined with clinical  observations, patient history, and epidemiological information. If result is POSITIVE SARS-CoV-2 target nucleic acids are DETECTED. The SARS-CoV-2 RNA is generally detectable in upper and lower  respiratory specimens dur ing the acute phase of infection.  Positive  results are indicative of active infection with SARS-CoV-2.  Clinical  correlation with patient history and other diagnostic information is  necessary to determine patient infection status.  Positive results do  not rule out bacterial infection or co-infection with other viruses. If result is PRESUMPTIVE POSTIVE SARS-CoV-2 nucleic acids MAY BE PRESENT.   A presumptive positive result was obtained on the submitted specimen  and confirmed on repeat testing.  While 2019 novel coronavirus  (SARS-CoV-2) nucleic acids may be present in the submitted sample  additional confirmatory testing may be necessary for epidemiological  and / or clinical management purposes  to differentiate between  SARS-CoV-2 and other Sarbecovirus currently known to infect humans.  If clinically indicated additional testing with an alternate test  methodology [phone] is advised. The SARS-CoV-2 RNA is generally  detectable  in upper and lower respiratory sp ecimens during the acute  phase of infection. The expected result is Negative. Fact Sheet for Patients:  BoilerBrush.com.cyhttps://www.fda.gov/media/136312/download Fact Sheet for Healthcare Providers: https://pope.com/https://www.fda.gov/media/136313/download This test is not yet approved or cleared by the [place] FDA and has been authorized for detection and/or diagnosis of SARS-CoV-2 by FDA under an Emergency Use Authorization (EUA).  This EUA will remain in effect (meaning this test can be used) for the duration of the COVID-19 declaration under Section 564(b)(1) of the Act, 21 U.S.C. section 360bbb-3(b)(1), unless the authorization is terminated or revoked sooner. Performed at [facility], [address] [place], [place] [address]   Culture, blood (routine x 2)     Status: None (Preliminary result)   Collection Time: 05/25/19  2:42 AM   Specimen: BLOOD LEFT HAND  Result Value Ref Range Status   Specimen Description BLOOD LEFT HAND  Final   Special Requests   Final    BOTTLES DRAWN AEROBIC ONLY Blood Culture results may not be optimal due to an inadequate volume of blood received in culture bottles Performed at [facility], [address] [place], [place] [address]    Culture PENDING  Incomplete   Report Status PENDING  Incomplete  Culture, blood (routine x 2)     Status: None (Preliminary result)   Collection Time: 05/25/19  2:43 AM   Specimen: BLOOD RIGHT HAND  Result Value Ref Range Status   Specimen Description BLOOD RIGHT HAND  Final   Special Requests   Final    BOTTLES DRAWN AEROBIC ONLY Blood Culture results may not be optimal due to an inadequate volume of blood received in culture bottles Performed at [facility], [address] [place], [place] [address]    Culture PENDING  Incomplete   Report Status PENDING  Incomplete      Radiology Studies: Ct Abdomen Pelvis W Contrast  Result Date: 05/24/2019 CLINICAL DATA:  28 year old  female with pyelonephritis and left flank pain. EXAM: CT ABDOMEN AND PELVIS WITH CONTRAST TECHNIQUE: Multidetector CT imaging of the abdomen and pelvis was performed using the standard protocol following bolus administration of intravenous contrast. CONTRAST:  100mL OMNIPAQUE IOHEXOL 300 MG/ML  SOLN COMPARISON:  CT of the abdomen pelvis dated 01/29/2019 FINDINGS: Lower chest: The visualized lung bases are clear. No intra-abdominal free air. Small free fluid within the pelvis. Hepatobiliary: The liver is unremarkable. Probable sludge or hyper concentrated bile within the gallbladder. No calcified stone or pericholecystic fluid. Pancreas: Unremarkable. No pancreatic ductal dilatation or surrounding inflammatory changes. Spleen: Normal in size without focal abnormality. Adrenals/Urinary Tract: The adrenal glands are unremarkable. There is a 1.5 x 1.5 cm hypoenhancing focus in the lateral interpolar left  renal parenchyma with surrounding inflammatory changes. This is most consistent with a focal area of pyelonephritis or a lobar nephronia. No drainable fluid collection identified at this time. Follow-up with ultrasound after treatment is recommended to exclude abscess. The right kidney is unremarkable. There is no hydronephrosis on either side. The visualized ureters and urinary bladder appear unremarkable. Stomach/Bowel: There is no bowel obstruction or active inflammation. Normal appendix. Vascular/Lymphatic: The abdominal aorta and IVC are unremarkable. No portal venous gas. There is no adenopathy. Reproductive: The uterus is anteverted and grossly unremarkable. The ovaries appear unremarkable as well. No pelvic mass. Other: None Musculoskeletal: No acute or significant osseous findings. IMPRESSION: 1. Focal area of pyelonephritis or lobar nephronia in the lateral interpolar left kidney. No drainable fluid collection identified at this time. Follow-up with ultrasound after treatment recommended to exclude development  of an abscess. 2. No bowel obstruction or active inflammation. Normal appendix. Electronically Signed   By: Elgie CollardArash  Radparvar M.D.   On: 05/24/2019 20:15     Scheduled Meds:  enoxaparin (LOVENOX) injection  40 mg Subcutaneous Q24H   insulin pump   Subcutaneous Q4H   ondansetron (ZOFRAN) IV  4 mg Intravenous Once   Continuous Infusions:  sodium chloride 125 mL/hr at 05/25/19 16100728   ciprofloxacin 400 mg (05/25/19 0733)     LOS: 0 days   Time Spent in minutes   45 minutes  Tori Cupps D.O. on 05/25/2019 at 9:33 AM  Between 7am to 7pm - Please see pager noted on [email or web address]  After 7pm go to [email or web address]  And look for the night coverage person covering for me after hours  [facility] Office  [phone]

## 2019-05-25 NOTE — Progress Notes (Addendum)
Hypoglycemic Event  CBG: 33  Treatment: 8 oz juice/soda per protocol and crackers and peanut butter Symptoms: Shaky  Follow-up CBG: Time:1345 CBG Result:44  Giving 8oz of juice again per protocol  Possible Reasons for Event: Inadequate meal intake  Comments/MD notified:Will notify  1497 CBG now 100 will text Dr. Ree Kida    Larysa Pall Margaretha Sheffield

## 2019-05-25 NOTE — Progress Notes (Addendum)
  NCM received consult:Follow up with PCP, no insurance. Thanks  [place]   [phone] [place] [phone]    Next Steps: Follow up on 06/07/2019    Instructions: 9:30 am with Dr. Ander Gaster Fulp     Pt without PCP,no insurance... NCM scheduled hospital f/u with [facility], noted on AVS and made pt aware. Whitman Hero RN,BSN,CM

## 2019-05-25 NOTE — Progress Notes (Signed)
Pharmacy Antibiotic Note  Margaret Conner is a 28 y.o. female admitted on 05/24/2019 with UTI.  Pharmacy has been consulted for cipro dosing. Cipro 400 mg given in ED  Plan: Cont cipro 400 mg IV q12 hours F/u renal function, cultures and clinical course     Temp (24hrs), Avg:99.4 F (37.4 C), Min:98 F (36.7 C), Max:100.1 F (37.8 C)  Recent Labs  Lab 05/24/19 1500  WBC 9.2  CREATININE 0.74    CrCl cannot be calculated (Unknown ideal weight.).    Allergies  Allergen Reactions  . Penicillins Anaphylaxis, Itching and Swelling    "Everything swells and burns" Did it involve swelling of the face/tongue/throat, SOB, or low BP? Yes Did it involve sudden or severe rash/hives, skin peeling, or any reaction on the inside of your mouth or nose? Yes Did you need to seek medical attention at a hospital or doctor's office? Yes When did it last happen?23 years ago If all above answers are "NO", may proceed with cephalosporin use.  . Cephalexin Swelling and Other (See Comments)    Severe abdominal cramping and discharge- "I didn't like the way it made me feel"     Thank you for allowing pharmacy to be a part of this patient's care.  Excell Seltzer Poteet 05/25/2019 1:47 AM

## 2019-05-25 NOTE — Progress Notes (Addendum)
Inpatient Diabetes Program Recommendations  AACE/ADA: New Consensus Statement on Inpatient Glycemic Control (2015)  Target Ranges:  Prepandial:   less than 140 mg/dL      Peak postprandial:   less than 180 mg/dL (1-2 hours)      Critically ill patients:  140 - 180 mg/dL   Lab Results  Component Value Date   GLUCAP 204 (H) 05/25/2019   HGBA1C 9.6 (A) 02/01/2019    Review of Glycemic Control Results for Margaret Conner, Margaret Conner (MRN [id number]) as of 05/25/2019 13:23  Ref. Range 05/25/2019 04:13 05/25/2019 06:16 05/25/2019 07:56  Glucose-Capillary Latest Ref Range: 70 - 99 mg/dL 68 (L) 114 (H) 204 (H)   Diabetes history: Type 1 DM Outpatient Diabetes medications: Medtronic insulin pump with Novolin R Current orders for Inpatient glycemic control: Insulin pump   Inpatient Diabetes Program Recommendations:     Noted patient was having lows this AM in the 60's mg/dL. Patient has not eaten and basal is set at 80%. Patient has resumed eating this AM, anticipate to improve. Also, last A1C was from 01/2019, consider repeating?  Spoke with patient regarding outpatient management of diabetes. Patient has a Medtronic insulin pump and was being managed by endocrinology at [facility]. Has not been in last 2 years because of change in insurance status. Reports interested in finding another endo in [place].  Reviewed patient's current A1c of 9.6%. Explained what a A1c is and what it measures. Also reviewed goal A1c with patient, importance of good glucose control @ home, and blood sugar goals. Reviewed patho of DM, vascular changes, impact of infection with poor glycemic control and other comorbidites. Patient has a meter and supplies. Will place case management consult to help arrange follow up with PCP for follow up and provided patient with endo list. Plans to make a follow up appointment.  Continues to use previous settings per last endo visit in 2017.   Current insulin pump settings are as  follows:  Basal insulin - set @80 % 12A 0.85 units/hour 0600 0.95 units/hour 1200 1.25 units/hour 1700 1.35 units/hour 2200 0.85 units/hour Total daily basal insulin: 31 units/24 hours  Carb Coverage 1:10 1 unit for every 10 grams of carbohydrates  Insulin Sensitivity 1:40 1 unit drops blood glucose 40 mg/dl  Target Glucose Goals 0000-0000 120 mg/dl  Explained to patient that Novolin R is not typically recommended given that participates can form in the tubing. Patient has been seen by [facility] in the past. Encouraged patient to ask about alternatives when she follows up with PCP using the pharmacy. Also, discussed with patient that in the event the team felt the insulin pump settings needed to be adjusted due to inpatient glucose goals, that subQ insulin would be recommended because of length of time since last endo visit. Patient verbalized understanding of information discussed and states that he does not have any further questions related to diabetes at this time.  Addendum@ 6195 Noted patient had severe hypoglycemic episode of 33 mg/dL at 1338. Discussed with RN to verify nothing had changed clinically. Spoke with patient again and confirmed that patient had felt she overestimated carb count on breakfast tray in addition to hypoglycemic treatment. Asked why she corrected for hypoglycemic treatment and patient states that she received >50 grams of carbs and only needed 15 grams for a blood sugar of 68 mg/dL. Also, patient did not have meter with her and was requesting a blood sugar check, so her estimate was based off the previous CBG. Flowsheet was not given to  patient. Patient given personal CBG meter from bag to be more accurate. Encouraged to write down boluses on flowsheet RN will provide. Reminded that plan will be to remove insulin pump if lows continue. Patient understands and has no further questions.  @1510 - Discussed plan of care with Dr Catha GosselinMikhail. Decided to continue with insulin  pump. Will plan to remove insulin pump if additional lows occur.  Reminded RN to utilize hypoglycemia protocol and education provided. Also, encouraged RN to have flowsheet in the room. This will hopefully help to prevent a future event.   NURSING: Once insulin pump order set is ordered please print off the Patient insulin pump contract and flow sheet. The insulin pump contract should be signed by the patient and then placed in the chart. The patient insulin pump flow sheet will be completed by the patient at the bedside and the RN caring for the patient will use the patient's flow sheet to [facility]. RN will need to complete the Nursing Insulin Pump Flowsheet at least once a shift. Patient will need to keep extra insulin pump supplies at the bedside at all times.    Thanks, Lujean RaveLauren Antoinne Spadaccini, MSN, RNC-OB Diabetes Coordinator [phone] (8a-5p)

## 2019-05-25 NOTE — Progress Notes (Signed)
Patient admitted to hospital pyelonephritis, arrived into the unit at about 2313. Patient is alert and oriented x 4, skin intact. Patient has Insulin pump from home which she self administered, consent for Insulin pump signed, patient has the insulin pump supplies with her in the room.  CBG monitored per order, orange juice given for low blood sugar, CBG is now within normal limit at this time (114). No sign of distress noted.

## 2019-05-25 NOTE — Progress Notes (Signed)
Offered to call family member for patient.  She said it was not necessary she was updating her family.

## 2019-05-26 DIAGNOSIS — R110 Nausea: Secondary | ICD-10-CM

## 2019-05-26 LAB — GLUCOSE, CAPILLARY
Glucose-Capillary: 90 mg/dL (ref 70–99)
Glucose-Capillary: 164 mg/dL — ABNORMAL HIGH (ref 70–99)
Glucose-Capillary: 204 mg/dL — ABNORMAL HIGH (ref 70–99)

## 2019-05-26 LAB — URINE CULTURE: Culture: 90000 — AB

## 2019-05-26 MED ORDER — ONDANSETRON HCL 4 MG PO TABS: 4.0000 mg | ORAL_TABLET | Freq: Four times a day (QID) | ORAL | 0 refills | Status: DC | PRN

## 2019-05-26 MED ORDER — DICLOFENAC SODIUM 1 % TD GEL: 2.0000 g | Freq: Four times a day (QID) | TRANSDERMAL | Status: DC | PRN

## 2019-05-26 MED ORDER — HYDROCODONE-ACETAMINOPHEN 5-325 MG PO TABS: 1.0000 | ORAL_TABLET | ORAL | 0 refills | Status: DC | PRN

## 2019-05-26 MED ORDER — CIPROFLOXACIN HCL 500 MG PO TABS: 500.0000 mg | ORAL_TABLET | Freq: Two times a day (BID) | ORAL | 0 refills | Status: AC

## 2019-05-26 MED FILL — ONDANSETRON HCL 4 MG TABLET: 4 | 5 days supply | Qty: 20 | Fill #0

## 2019-05-26 MED FILL — HYDROCODON-APAP 5-325: 5-325 | 4 days supply | Qty: 15 | Fill #0

## 2019-05-26 MED FILL — CIPROFLOXACIN HCL 500 MG TA: 500 | 8 days supply | Qty: 16 | Fill #0

## 2019-05-26 NOTE — Discharge Summary (Signed)
Physician Discharge Summary  Margaret Conner ZOX:[id number] DOB: 03/10/1991 DOA: 05/24/2019  PCP: System, Pcp Not In  Admit date: 05/24/2019 Discharge date: 05/26/2019  Time spent: 45 minutes  Recommendations for Outpatient Follow-up:  Patient will be discharged to home.  Patient will need to follow up with primary care provider within one week of discharge.  Patient should continue medications as prescribed.  Patient should follow a carb modified diet.   Discharge Diagnoses:  Acute pyelonephritis with intractable left flank pain Persistent nausea  Diabetes mellitus, type I  Discharge Condition: Stable  Diet recommendation: Carb modified   There were no vitals filed for this visit.  History of present illness:  on 05/24/2019 by Dr. Midge MiniumArshad Conner  Margaret Blackistoneis a 28 y.o.femalewithhistory of diabetes mellitus type 1 presents to the ER with ongoing left flank pain. Patient symptoms started yesterday with some mild left flank pain which started to hurt more. Patient also started having fever chills nausea unable to keep anything. Denies diarrhea. Has been having some dysuria on urinating last few days. Denies any chest pain shortness of breath productive cough headache or visual symptoms.  Hospital Course:  Acute pyelonephritis with intractable left flank pain -Currently afebrile, no leukocytosis -UA bacteria, 11-20 WBC, moderate leukocytes, negative nitrates -CT A/P: Focal area of pyelonephritis or lobar nephronia in the lateral interpolar left kidney. No drainable fluid collections in the file at this time.  Follow-up ultrasound after treatment recommended to exclude development of an abscess. -Blood cultures show no growth to datet -Urine culture shows 90K GNR, klebsiella pneumoniae -Will discharge patient with cipro, antiemetics, pain control with limited number of hydrocodone   Persistent nausea  -Likely secondary to the above -Improved -able to  tolerate diet  Diabetes mellitus, type I -Currently using an insulin pump -patient to follow up with PCP  Consultants None  Procedures  Renal ultrasound  Discharge Exam: Vitals:   05/25/19 2131 05/26/19 0615  BP: 115/80 112/74  Pulse: 71 63  Resp:    Temp: 98.3 F (36.8 C) 97.6 F (36.4 C)  SpO2: 98% 100%     General: Well developed, well nourished, NAD, appears stated age  HEENT: NCAT, mucous membranes moist.  Neck: Supple  Cardiovascular: S1 S2 auscultated, RRR, no murmur  Respiratory: Clear to auscultation bilaterally   Abdomen: Soft, nondistended, + bowel sounds  Extremities: warm dry without cyanosis clubbing or edema  Neuro: AAOx3, nonfocal  Psych: Normal affect and demeanor   Discharge Instructions  Allergies as of 05/26/2019      Reactions   Penicillins Anaphylaxis, Itching, Swelling   "Everything swells and burns" Did it involve swelling of the face/tongue/throat, SOB, or low BP? Yes Did it involve sudden or severe rash/hives, skin peeling, or any reaction on the inside of your mouth or nose? Yes Did you need to seek medical attention at a hospital or doctor's office? Yes When did it last happen?23 years ago If all above answers are "NO", may proceed with cephalosporin use.   Cephalexin Swelling, Other (See Comments)   Severe abdominal cramping and discharge- "I didn't like the way it made me feel"      Medication List    TAKE these medications   ciprofloxacin 500 MG tablet Commonly known as: Cipro Take 1 tablet (500 mg total) by mouth 2 (two) times daily for 8 days.   diclofenac sodium 1 % Gel Commonly known as: VOLTAREN Apply 2 g topically 4 (four) times daily as needed.   HYDROcodone-acetaminophen 5-325 MG tablet Commonly  known as: Norco Take 1 tablet by mouth every 4 (four) hours as needed for severe pain.   ibuprofen 200 MG tablet Commonly known as: ADVIL Take 600 mg by mouth every 6 (six) hours as needed (for headaches or  pain).   insulin pump Soln Inject 1 each into the skin continuous. Pump uses Novolog   ondansetron 4 MG tablet Commonly known as: ZOFRAN Take 1 tablet (4 mg total) by mouth every 6 (six) hours as needed for nausea.      Allergies  Allergen Reactions  . Penicillins Anaphylaxis, Itching and Swelling    "Everything swells and burns" Did it involve swelling of the face/tongue/throat, SOB, or low BP? Yes Did it involve sudden or severe rash/hives, skin peeling, or any reaction on the inside of your mouth or nose? Yes Did you need to seek medical attention at a hospital or doctor's office? Yes When did it last happen?23 years ago If all above answers are "NO", may proceed with cephalosporin use.  . Cephalexin Swelling and Other (See Comments)    Severe abdominal cramping and discharge- "I didn't like the way it made me feel"   Follow-up Information    [facility] Follow up on 06/07/2019.   Why: 9:30 am with Dr. Cain Saupe Contact information: [address] [place] [address] [phone]           The results of significant diagnostics from this hospitalization (including imaging, microbiology, ancillary and laboratory) are listed below for reference.    Significant Diagnostic Studies: Ct Abdomen Pelvis W Contrast  Result Date: 05/24/2019 CLINICAL DATA:  28 year old female with pyelonephritis and left flank pain. EXAM: CT ABDOMEN AND PELVIS WITH CONTRAST TECHNIQUE: Multidetector CT imaging of the abdomen and pelvis was performed using the standard protocol following bolus administration of intravenous contrast. CONTRAST:  OMNIPAQUE IOHEXOL 300 MG/ML  SOLN COMPARISON:  CT of the abdomen pelvis dated 01/29/2019 FINDINGS: Lower chest: The visualized lung bases are clear. No intra-abdominal free air. Small free fluid within the pelvis. Hepatobiliary: The liver is unremarkable. Probable sludge or hyper concentrated bile  within the gallbladder. No calcified stone or pericholecystic fluid. Pancreas: Unremarkable. No pancreatic ductal dilatation or surrounding inflammatory changes. Spleen: Normal in size without focal abnormality. Adrenals/Urinary Tract: The adrenal glands are unremarkable. There is a 1.5 x 1.5 cm hypoenhancing focus in the lateral interpolar left renal parenchyma with surrounding inflammatory changes. This is most consistent with a focal area of pyelonephritis or a lobar nephronia. No drainable fluid collection identified at this time. Follow-up with ultrasound after treatment is recommended to exclude abscess. The right kidney is unremarkable. There is no hydronephrosis on either side. The visualized ureters and urinary bladder appear unremarkable. Stomach/Bowel: There is no bowel obstruction or active inflammation. Normal appendix. Vascular/Lymphatic: The abdominal aorta and IVC are unremarkable. No portal venous gas. There is no adenopathy. Reproductive: The uterus is anteverted and grossly unremarkable. The ovaries appear unremarkable as well. No pelvic mass. Other: None Musculoskeletal: No acute or significant osseous findings. IMPRESSION: 1. Focal area of pyelonephritis or lobar nephronia in the lateral interpolar left kidney. No drainable fluid collection identified at this time. Follow-up with ultrasound after treatment recommended to exclude development of an abscess. 2. No bowel obstruction or active inflammation. Normal appendix. Electronically Signed   By: Elgie Collard M.D.   On: 05/24/2019 20:15   US Renal  Result Date: 05/25/2019 CLINICAL DATA:  Pyelonephritis. EXAM: RENAL / URINARY TRACT ULTRASOUND  COMPLETE COMPARISON:  CT scan of May 24, 2019.  Ultrasound of January 29, 2019. FINDINGS: Right Kidney: Renal measurements: 11.3 x 5.9 x 4.9 cm = volume: 168 mL . Echogenicity within normal limits. No mass or hydronephrosis visualized. Left Kidney: Renal measurements: 10.1 x 5.2 x 4.4 cm = volume: 121  mL. Echogenicity within normal limits. No mass or hydronephrosis visualized. Bladder: Appears normal for degree of bladder distention. IMPRESSION: Normal renal ultrasound. Electronically Signed   By: Lupita RaiderJames  Green Jr M.D.   On: 05/25/2019 09:36    Microbiology: Recent Results (from the past 240 hour(s))  Urine Culture     Status: Abnormal   Collection Time: 05/24/19  6:12 PM   Specimen: Urine, Random  Result Value Ref Range Status   Specimen Description URINE, RANDOM  Final   Special Requests   Final    NONE Performed at [facility], [address] [place], [place] [address]    Culture 90,000 COLONIES/mL KLEBSIELLA PNEUMONIAE (A)  Final   Report Status 05/26/2019 FINAL  Final   Organism ID, Bacteria KLEBSIELLA PNEUMONIAE (A)  Final      Susceptibility   Klebsiella pneumoniae - MIC*    AMPICILLIN >=32 RESISTANT Resistant     CEFAZOLIN <=4 SENSITIVE Sensitive     CEFTRIAXONE <=1 SENSITIVE Sensitive     CIPROFLOXACIN <=0.25 SENSITIVE Sensitive     GENTAMICIN <=1 SENSITIVE Sensitive     IMIPENEM <=0.25 SENSITIVE Sensitive     NITROFURANTOIN 64 INTERMEDIATE Intermediate     TRIMETH/SULFA >=320 RESISTANT Resistant     AMPICILLIN/SULBACTAM >=32 RESISTANT Resistant     PIP/TAZO 64 INTERMEDIATE Intermediate     Extended ESBL NEGATIVE Sensitive     * 90,000 COLONIES/mL KLEBSIELLA PNEUMONIAE  SARS Coronavirus 2 (CEPHEID - Performed in [facility] hospital lab), Hosp Order     Status: None   Collection Time: 05/24/19  8:57 PM   Specimen: Nasopharyngeal Swab  Result Value Ref Range Status   SARS Coronavirus 2 NEGATIVE NEGATIVE Final    Comment: (NOTE) If result is NEGATIVE SARS-CoV-2 target nucleic acids are NOT DETECTED. The SARS-CoV-2 RNA is generally detectable in upper and lower  respiratory specimens during the acute phase of infection. The lowest  concentration of SARS-CoV-2 viral copies this assay can detect is 250  copies / mL. A negative result does not preclude  SARS-CoV-2 infection  and should not be used as the sole basis for treatment or other  patient management decisions.  A negative result may occur with  improper specimen collection / handling, submission of specimen other  than nasopharyngeal swab, presence of viral mutation(s) within the  areas targeted by this assay, and inadequate number of viral copies  (<250 copies / mL). A negative result must be combined with clinical  observations, patient history, and epidemiological information. If result is POSITIVE SARS-CoV-2 target nucleic acids are DETECTED. The SARS-CoV-2 RNA is generally detectable in upper and lower  respiratory specimens dur ing the acute phase of infection.  Positive  results are indicative of active infection with SARS-CoV-2.  Clinical  correlation with patient history and other diagnostic information is  necessary to determine patient infection status.  Positive results do  not rule out bacterial infection or co-infection with other viruses. If result is PRESUMPTIVE POSTIVE SARS-CoV-2 nucleic acids MAY BE PRESENT.   A presumptive positive result was obtained on the submitted specimen  and confirmed on repeat testing.  While 2019 novel coronavirus  (SARS-CoV-2) nucleic acids may be  present in the submitted sample  additional confirmatory testing may be necessary for epidemiological  and / or clinical management purposes  to differentiate between  SARS-CoV-2 and other Sarbecovirus currently known to infect humans.  If clinically indicated additional testing with an alternate test  methodology [phone]([id number]) is advised. The SARS-CoV-2 RNA is generally  detectable in upper and lower respiratory sp ecimens during the acute  phase of infection. The expected result is Negative. Fact Sheet for Patients:  BoilerBrush.com.cyhttps://www.fda.gov/media/136312/download Fact Sheet for Healthcare Providers: https://pope.com/https://www.fda.gov/media/136313/download This test is not yet approved or cleared by the  [place] FDA and has been authorized for detection and/or diagnosis of SARS-CoV-2 by FDA under an Emergency Use Authorization (EUA).  This EUA will remain in effect (meaning this test can be used) for the duration of the COVID-19 declaration under Section 564(b)(1) of the Act, 21 U.S.C. section 360bbb-3(b)(1), unless the authorization is terminated or revoked sooner. Performed at [facility], [address] [place], [place] [address]   Culture, blood (routine x 2)     Status: None (Preliminary result)   Collection Time: 05/25/19  2:42 AM   Specimen: BLOOD LEFT HAND  Result Value Ref Range Status   Specimen Description BLOOD LEFT HAND  Final   Special Requests   Final    BOTTLES DRAWN AEROBIC ONLY Blood Culture results may not be optimal due to an inadequate volume of blood received in culture bottles   Culture   Final    NO GROWTH 1 DAY Performed at [facility], [address] [place], [place] [address]    Report Status PENDING  Incomplete  Culture, blood (routine x 2)     Status: None (Preliminary result)   Collection Time: 05/25/19  2:43 AM   Specimen: BLOOD RIGHT HAND  Result Value Ref Range Status   Specimen Description BLOOD RIGHT HAND  Final   Special Requests   Final    BOTTLES DRAWN AEROBIC ONLY Blood Culture results may not be optimal due to an inadequate volume of blood received in culture bottles   Culture   Final    NO GROWTH 1 DAY Performed at [facility], [address] [place], [place] [address]    Report Status PENDING  Incomplete     Labs: Basic Metabolic Panel: Recent Labs  Lab 05/24/19 1500 05/25/19 0244  NA 137 137  K 3.8 3.7  CL 103 104  CO2 26 22  GLUCOSE 177* 60*  BUN 6 <5*  CREATININE 0.74 0.72  CALCIUM 9.2 8.8*   Liver Function Tests: Recent Labs  Lab 05/24/19 1500 05/25/19 0244  AST 17 15  ALT 15 11  ALKPHOS 63 61  BILITOT 0.9 0.6  PROT 6.8 5.8*  ALBUMIN 3.9 3.8   No results for input(s): LIPASE,  AMYLASE in the last 168 hours. No results for input(s): AMMONIA in the last 168 hours. CBC: Recent Labs  Lab 05/24/19 1500 05/25/19 0244  WBC 9.2 8.4  NEUTROABS  --  5.5  HGB 14.3 14.2  HCT 42.8 42.8  MCV 90.1 89.9  PLT 184 157   Cardiac Enzymes: No results for input(s): CKTOTAL, CKMB, CKMBINDEX, TROPONINI in the last 168 hours. BNP: BNP (last 3 results) No results for input(s): BNP in the last 8760 hours.  ProBNP (last 3 results) No results for input(s): PROBNP in the last 8760 hours.  CBG: Recent Labs  Lab 05/25/19 1552 05/25/19 2010 05/25/19 2337 05/26/19 0406 05/26/19 0800  GLUCAP 125*  97 173* 90 164*       Signed:  Jettson Crable  [facility] 05/26/2019, 10:44 AM

## 2019-05-26 NOTE — TOC Transition Note (Addendum)
Transition of Care [facility]) - CM/SW Discharge Note   Patient Details  Name: Margaret Conner MRN: [id number] Date of Birth: [birth year]  Transition of Care [facility]) CM/SW Contact:  Sharin Mons, RN Phone Number: 05/26/2019, 2:45 PM   Clinical Narrative:    Admitted with  Acute pyelonephritis.  Transition home with fiance' today. Pt without PCP, NCM shared [place]. Pt expressed interest. NCM scheduled hospital f/u @ [place], noted on AVS and made pt aware.  Jama Ilagan (Mother) Aster Screws (Father)    [phone]     Family to provide transportation home.   [place]   [phone] [place] [phone]    Next Steps: Follow up on 06/07/2019         Instructions: 9:30 am with Dr. Antony Blackbird             Final next level of care: Home/Self Care Barriers to Discharge: No Barriers Identified   Patient Goals and CMS Choice       Discharge Placement                       Discharge Plan and Services                                     Social Determinants of Health (SDOH) Interventions     Readmission Risk Interventions No flowsheet data found.

## 2019-05-26 NOTE — Progress Notes (Signed)
[name] to be D/C'd Home per MD order.  Discussed with the patient and all questions fully answered.  VSS, Skin clean, dry and intact without evidence of skin break down, no evidence of skin tears noted. IV catheter discontinued intact. Site without signs and symptoms of complications. Dressing and pressure applied.  An After Visit Summary was printed and given to the patient. Patient received prescription.  D/c education completed with patient/family including follow up instructions, medication list, d/c activities limitations if indicated, with other d/c instructions as indicated by MD - patient able to verbalize understanding, all questions fully answered.   Patient instructed to return to ED, call 911, or call MD for any changes in condition.   Patient escorted via [place], and D/C home via private auto.  [name] 05/26/2019 4:17 PM

## 2019-05-30 LAB — CULTURE, BLOOD (ROUTINE X 2)
Culture: NO GROWTH
Culture: NO GROWTH

## 2019-06-07 ENCOUNTER — Inpatient Hospital Stay: Payer: Medicaid Other | Admitting: Family Medicine

## 2019-11-16 ENCOUNTER — Other Ambulatory Visit: Payer: PRIVATE HEALTH INSURANCE

## 2020-04-03 ENCOUNTER — Other Ambulatory Visit: Payer: Self-pay

## 2020-04-03 ENCOUNTER — Emergency Department (HOSPITAL_COMMUNITY): Payer: Self-pay

## 2020-04-03 ENCOUNTER — Emergency Department (HOSPITAL_COMMUNITY)
Admission: EM | Admit: 2020-04-03 | Discharge: 2020-04-03 | Disposition: A | Discharge status: Home / Self Care | Payer: Self-pay | Attending: Emergency Medicine | Admitting: Emergency Medicine

## 2020-04-03 DIAGNOSIS — R6883 Chills (without fever): Secondary | ICD-10-CM | POA: Insufficient documentation

## 2020-04-03 DIAGNOSIS — R350 Frequency of micturition: Secondary | ICD-10-CM | POA: Insufficient documentation

## 2020-04-03 DIAGNOSIS — Z794 Long term (current) use of insulin: Secondary | ICD-10-CM | POA: Insufficient documentation

## 2020-04-03 DIAGNOSIS — E109 Type 1 diabetes mellitus without complications: Secondary | ICD-10-CM | POA: Insufficient documentation

## 2020-04-03 DIAGNOSIS — R109 Unspecified abdominal pain: Secondary | ICD-10-CM | POA: Insufficient documentation

## 2020-04-03 DIAGNOSIS — R300 Dysuria: Secondary | ICD-10-CM | POA: Insufficient documentation

## 2020-04-03 DIAGNOSIS — J45909 Unspecified asthma, uncomplicated: Secondary | ICD-10-CM | POA: Insufficient documentation

## 2020-04-03 LAB — CBC WITH DIFFERENTIAL/PLATELET
Abs Immature Granulocytes: 0.03 10*3/uL (ref 0.00–0.07)
Basophils Absolute: 0 10*3/uL (ref 0.0–0.1)
Basophils Relative: 0 %
Eosinophils Absolute: 0 10*3/uL (ref 0.0–0.5)
Eosinophils Relative: 0 %
HCT: 43.8 % (ref 36.0–46.0)
Hemoglobin: 14.2 g/dL (ref 12.0–15.0)
Immature Granulocytes: 0 %
Lymphocytes Relative: 10 %
Lymphs Abs: 1 10*3/uL (ref 0.7–4.0)
MCH: 29.3 pg (ref 26.0–34.0)
MCHC: 32.4 g/dL (ref 30.0–36.0)
MCV: 90.5 fL (ref 80.0–100.0)
Monocytes Absolute: 0.9 10*3/uL (ref 0.1–1.0)
Monocytes Relative: 10 %
Neutro Abs: 7.4 10*3/uL (ref 1.7–7.7)
Neutrophils Relative %: 80 %
Platelets: 242 10*3/uL (ref 150–400)
RBC: 4.84 MIL/uL (ref 3.87–5.11)
RDW: 11.8 % (ref 11.5–15.5)
WBC: 9.4 10*3/uL (ref 4.0–10.5)
nRBC: 0 % (ref 0.0–0.2)

## 2020-04-03 LAB — URINALYSIS, ROUTINE W REFLEX MICROSCOPIC
Bilirubin Urine: NEGATIVE
Glucose, UA: 50 mg/dL — AB
Hgb urine dipstick: NEGATIVE
Ketones, ur: NEGATIVE mg/dL
Leukocytes,Ua: NEGATIVE
Nitrite: NEGATIVE
Protein, ur: NEGATIVE mg/dL
Specific Gravity, Urine: 1.011 (ref 1.005–1.030)
pH: 7 (ref 5.0–8.0)

## 2020-04-03 LAB — BASIC METABOLIC PANEL
Anion gap: 11 (ref 5–15)
BUN: 10 mg/dL (ref 6–20)
CO2: 28 mmol/L (ref 22–32)
Calcium: 9.6 mg/dL (ref 8.9–10.3)
Chloride: 101 mmol/L (ref 98–111)
Creatinine, Ser: 0.7 mg/dL (ref 0.44–1.00)
GFR calc Af Amer: >60 mL/min (ref >60)
GFR calc non Af Amer: >60 mL/min (ref >60)
Glucose, Bld: 115 mg/dL — ABNORMAL HIGH (ref 70–99)
Potassium: 3.8 mmol/L (ref 3.5–5.1)
Sodium: 140 mmol/L (ref 135–145)

## 2020-04-03 LAB — CBG MONITORING, ED
Glucose-Capillary: 37 mg/dL — CL (ref 70–99)
Glucose-Capillary: 45 mg/dL — ABNORMAL LOW (ref 70–99)
Glucose-Capillary: 128 mg/dL — ABNORMAL HIGH (ref 70–99)
Glucose-Capillary: 145 mg/dL — ABNORMAL HIGH (ref 70–99)
Glucose-Capillary: 121 mg/dL — ABNORMAL HIGH (ref 70–99)

## 2020-04-03 LAB — I-STAT BETA HCG BLOOD, ED (MC, WL, AP ONLY): I-stat hCG, quantitative: <5 m[IU]/mL (ref <5)

## 2020-04-03 LAB — PREGNANCY, URINE: Preg Test, Ur: NEGATIVE

## 2020-04-03 MED ORDER — IBUPROFEN 800 MG PO TABS
800.0000 mg | ORAL_TABLET | Freq: Three times a day (TID) | ORAL | 0 refills | Status: AC | PRN
Start: 2020-04-03 — End: ?

## 2020-04-03 MED ORDER — HYDROMORPHONE HCL 1 MG/ML IJ SOLN
1.0000 mg | Freq: Once | INTRAMUSCULAR | Status: AC
  Administered 2020-04-03: 14:00:00 1 mg via INTRAVENOUS
  Filled 2020-04-03: qty 1

## 2020-04-03 MED ORDER — HYDROCODONE-ACETAMINOPHEN 5-325 MG PO TABS: 1.0000 | ORAL_TABLET | Freq: Four times a day (QID) | ORAL | 0 refills | Status: DC | PRN

## 2020-04-03 MED ORDER — KETOROLAC TROMETHAMINE 15 MG/ML IJ SOLN
15.0000 mg | Freq: Once | INTRAMUSCULAR | Status: AC
  Administered 2020-04-03: 16:00:00 15 mg via INTRAVENOUS
  Filled 2020-04-03: qty 1

## 2020-04-03 MED ORDER — CIPROFLOXACIN HCL 500 MG PO TABS
500.0000 mg | ORAL_TABLET | Freq: Two times a day (BID) | ORAL | 0 refills | Status: AC
Start: 2020-04-03 — End: 2020-04-10

## 2020-04-03 MED ORDER — ONDANSETRON HCL 4 MG/2ML IJ SOLN
4.0000 mg | Freq: Once | INTRAMUSCULAR | Status: AC
  Administered 2020-04-03: 11:00:00 4 mg via INTRAVENOUS
  Filled 2020-04-03: qty 2

## 2020-04-03 MED ORDER — OXYCODONE-ACETAMINOPHEN 5-325 MG PO TABS
2.0000 | ORAL_TABLET | Freq: Once | ORAL | Status: AC
  Administered 2020-04-03: 2 via ORAL
  Filled 2020-04-03: qty 2

## 2020-04-03 NOTE — ED Provider Notes (Signed)
[facility] EMERGENCY DEPARTMENT Provider Note   CSN: [id number] Arrival date & time: 04/03/20  0755     History Chief Complaint  Patient presents with  . Fever  . Generalized Body Aches  . Flank Pain    Margaret Conner is a 29 y.o. female.  29 year old female who presents with body aches and chills consistent with pyelonephritis.  Does note increased urination but denies any dysuria.  Low-grade temperature up to 99.9 at home.  Has had nausea but no vomiting.  Flank pain is been constant and unrelieved with alternating Tylenol or Motrin.  Denies any vaginal symptoms.        Past Medical History:  Diagnosis Date  . Asthma   . Diabetes mellitus     Patient Active Problem List   Diagnosis Date Noted  . Pyelonephritis 05/24/2019  . Diabetes mellitus 02/01/2019  . Type 1 diabetes (HCC) 02/01/2019    No past surgical history on file.   OB History   No obstetric history on file.     Family History  Problem Relation Age of Onset  . Diabetes Mellitus II Neg Hx     Social History   Tobacco Use  . Smoking status: Never Smoker  . Smokeless tobacco: Never Used  Substance Use Topics  . Alcohol use: Yes  . Drug use: No    Home Medications Prior to Admission medications   Medication Sig Start Date End Date Taking? Authorizing Provider  diclofenac sodium (VOLTAREN) 1 % GEL Apply 2 g topically 4 (four) times daily as needed. 05/26/19   Mikhail, Nita Sells, DO  HYDROcodone-acetaminophen (NORCO) 5-325 MG tablet Take 1 tablet by mouth every 4 (four) hours as needed for severe pain. 05/26/19   Mikhail, Nita Sells, DO  ibuprofen (ADVIL,MOTRIN) 200 MG tablet Take 600 mg by mouth every 6 (six) hours as needed (for headaches or pain).     [provider]  Insulin Human (INSULIN PUMP) SOLN Inject 1 each into the skin continuous. Pump uses Novolog    [provider]  ondansetron (ZOFRAN) 4 MG tablet Take 1 tablet (4 mg total) by mouth every 6  (six) hours as needed for nausea. 05/26/19   Edsel Petrin, DO    Allergies    Penicillins and Cephalexin  Review of Systems   Review of Systems  All other systems reviewed and are negative.   Physical Exam Updated Vital Signs BP (!) 150/91 (BP Location: Left Arm)   Pulse 79   Temp 98.9 F (37.2 C) (Oral)   Resp 14   Ht 1.676 m (5\' 6" )   Wt 70.3 kg   SpO2 100%   BMI 25.02 kg/m   Physical Exam Vitals and nursing note reviewed.  Constitutional:      General: She is not in acute distress.    Appearance: Normal appearance. She is well-developed. She is not toxic-appearing.  HENT:     Head: Normocephalic and atraumatic.  Eyes:     General: Lids are normal.     Conjunctiva/sclera: Conjunctivae normal.     Pupils: Pupils are equal, round, and reactive to light.  Neck:     Thyroid: No thyroid mass.     Trachea: No tracheal deviation.  Cardiovascular:     Rate and Rhythm: Normal rate and regular rhythm.     Heart sounds: Normal heart sounds. No murmur. No gallop.   Pulmonary:     Effort: Pulmonary effort is normal. No respiratory distress.     Breath sounds:  Normal breath sounds. No stridor. No decreased breath sounds, wheezing, rhonchi or rales.  Abdominal:     General: Bowel sounds are normal. There is no distension.     Palpations: Abdomen is soft.     Tenderness: There is no abdominal tenderness. There is no rebound.  Musculoskeletal:        General: No tenderness. Normal range of motion.     Cervical back: Normal range of motion and neck supple.     Lumbar back: Spasms present.       Back:  Skin:    General: Skin is warm and dry.     Findings: No abrasion or rash.  Neurological:     Mental Status: She is alert and oriented to person, place, and time.     GCS: GCS eye subscore is 4. GCS verbal subscore is 5. GCS motor subscore is 6.     Cranial Nerves: No cranial nerve deficit.     Sensory: No sensory deficit.  Psychiatric:        Speech: Speech normal.          Behavior: Behavior normal.     ED Results / Procedures / Treatments   Labs (all labs ordered are listed, but only abnormal results are displayed) Labs Reviewed  URINE CULTURE  CBC WITH DIFFERENTIAL/PLATELET  PREGNANCY, URINE  BASIC METABOLIC PANEL  I-STAT BETA HCG BLOOD, ED ([facility], [facility], [facility] ONLY)    EKG None  Radiology No results found.  Procedures Procedures (including critical care time)  Medications Ordered in ED Medications  oxyCODONE-acetaminophen (PERCOCET/ROXICET) 5-325 MG per tablet 2 tablet (has no administration in time range)    ED Course  I have reviewed the triage vital signs and the nursing notes.  Pertinent labs & imaging results that were available during my care of the patient were reviewed by me and considered in my medical decision making (see chart for details).    MDM Rules/Calculators/A&P                      Patient medicated for pain here and feels better.  She did have 2 episodes of hypoglycemia that was treated with orange juice with sugar.  Urinalysis without infection.  Renal function and CBC without acute abnormalities.  Renal CT pending and signed to Dr. Laverta Baltimore Final Clinical Impression(s) / ED Diagnoses Final diagnoses:  None    Rx / DC Orders ED Discharge Orders    None       Lacretia Leigh, MD 04/03/20 [phone]

## 2020-04-03 NOTE — ED Provider Notes (Signed)
Blood pressure 117/84, pulse 64, temperature 99.7 F (37.6 C), resp. rate 20, height 5\' 6"  (1.676 m), weight 70.3 kg, SpO2 100 %.  Assuming care from Dr. .  In short, Margaret Conner is a 29 y.o. female with a chief complaint of Fever, Generalized Body Aches, and Flank Pain .  Refer to the original H&P for additional details.  The current plan of care is to f/u on renal CT and reasses.  05:06 PM  CT shows some mild perinephric stranding consistent with either recently passed kidney stone or early pyelonephritis.  The patient's urine does not show evidence of infection and has been sent for culture.  Patient does have history of pyelonephritis multiple times in the past.  I have reviewed her urine cultures which have grown Klebsiella resistant to several antibiotics including Bactrim.  The patient is very concerned about the possibility of this being early pyelonephritis. I have agreed to provide her a watch and wait prescription for Cipro which has been sensitive to her infections in the past.  I also provided a short course of hydrocodone along with NSAID.  We discussed need for follow-up both with the primary care doctor and a urologist along with ED return precautions.    26, MD 04/03/20 [phone]

## 2020-04-03 NOTE — ED Notes (Signed)
Pt asked for her CBG checked and stated that " can you let them know in my chart that I dont want to go home because of how much pain I am in and that im going to be back in two days."   EDMD aware of pt complaint

## 2020-04-03 NOTE — ED Triage Notes (Signed)
Pt arrives pov with headache, fever, body aches and L flank pain. Pt states she feels dehydrated. Reports hx pyelonephritis and states this feels similar.

## 2020-04-03 NOTE — Discharge Instructions (Addendum)
You were seen in the emergency department today with flank pain.  Your urine does not show infection but we are sending this for culture.  I have reviewed your prior urine cultures and scans.  I have provided pain medications for you to take.  Hydrocodone can cause drowsiness and you cannot take it while driving or with other pain or anxiety medications.  This medication can cause dependency and you should take it only as needed for severe pain.  I also called in a prescription for Motrin.  I have also called in a prescription for Cipro.  Your infections have been sensitive to this medication in the past.  It does have significant side effects including the possibility of tendon rupture.  I would like for you to take only if you develop worsening fever or pain and if your symptoms are overall improving I would like for you to avoid taking this medicine.  If you develop pain in your joints you should stop taking this antibiotic immediately.

## 2020-04-03 NOTE — ED Notes (Signed)
Patient given discharge instructions patient verbalizes understanding. 

## 2020-04-03 NOTE — ED Notes (Signed)
RN gave pt 8oz of orange juice. Pt states she is feeling a tad better

## 2020-04-03 NOTE — Progress Notes (Signed)
Inpatient Diabetes Program Recommendations  AACE/ADA: New Consensus Statement on Inpatient Glycemic Control (2015)  Target Ranges:  Prepandial:   less than 140 mg/dL      Peak postprandial:   less than 180 mg/dL (1-2 hours)      Critically ill patients:  140 - 180 mg/dL   Lab Results  Component Value Date   GLUCAP 145 (H) 04/03/2020   HGBA1C 9.6 (A) 02/01/2019    Review of Glycemic Control Results for Margaret Conner, Margaret Conner (MRN [id number]) as of 04/03/2020 15:21  Ref. Range 04/03/2020 09:49 04/03/2020 10:02 04/03/2020 10:46 04/03/2020 12:00  Glucose-Capillary Latest Ref Range: 70 - 99 mg/dL 37 (LL) 45 (L) 419 (H) 145 (H)   Diabetes history: DM1(does not make insulin.  Needs correction, basal and meal coverage)  Outpatient Diabetes medications: Novolin R with Medtronic Insulin Pump + CGM   Current orders for Inpatient glycemic control: None  Inpatient Diabetes Program Recommendations:    If patient is to stay on insulin pump, please order Insulin Pump Therapy order set.    Note:  Spoke with patient at bedside.  DM1-diagnosed at 29 years old. She has her insulin pump on currently and it is infusing.  Site is on the right abdomen.  It is due to be changed tomorrow; she has supplies but does not have a vial of insulin.   She checks her blood sugar TID.  She wears a CGM; currently not wearing.  She has not seen her endocrinologist for over 2 years at [facility].  She checked her blood sugar this am prior to coming to the ED and it was 180 mg/dl.  She corrected and became low on arrival to ED.  She states she feels her lows around 65 mg/dl and corrects with juice or glucose tablets.  She currently does not have insurance.  She has started a new job and will have insurance in July.  She plans on making an appointment with a new endocrinologist closer to the area.  Will watch for orders and follow if admitted.    Thank you, Dulce Sellar, RN, BSN Diabetes Coordinator Inpatient Diabetes  Program [phone] (team pager from 8a-5p)

## 2020-04-05 LAB — URINE CULTURE: Culture: >=100000 — AB

## 2020-04-06 ENCOUNTER — Telehealth: Payer: Self-pay | Admitting: Emergency Medicine

## 2020-04-06 NOTE — Telephone Encounter (Signed)
Post ED Visit - Positive Culture Follow-up: Successful Patient Follow-Up  Culture assessed and recommendations reviewed by:  []  , Pharm.D. []  Enzo Bi, Pharm.D., BCPS AQ-ID []  , Pharm.D., BCPS []  Celedonio Miyamoto, Pharm.D., BCPS []  [place], Garvin Fila.D., BCPS, AAHIVP []  , Pharm.D., BCPS, AAHIVP []  Georgina Pillion, PharmD, BCPS []  , PharmD, BCPS []  [place], PharmD, BCPS [x]  [address], PharmD  Positive urine culture  []  Patient discharged without antimicrobial prescription and treatment is now indicated [x]  Organism is resistant to prescribed ED discharge antimicrobial []  Patient with positive blood cultures  Changes discussed with ED provider: PA New antibiotic prescription: Bactrim DS one tab PO BID x seven days Called to [facility] [phone], [name])  Contacted patient, date 04/06/2020, time 1530   Margaret Conner 04/06/2020, 3:45 PM

## 2020-07-27 ENCOUNTER — Encounter (HOSPITAL_COMMUNITY): Payer: Self-pay | Admitting: Emergency Medicine

## 2020-07-27 ENCOUNTER — Emergency Department (HOSPITAL_COMMUNITY): Payer: No Typology Code available for payment source

## 2020-07-27 ENCOUNTER — Other Ambulatory Visit: Payer: Self-pay

## 2020-07-27 ENCOUNTER — Encounter (HOSPITAL_COMMUNITY): Payer: Self-pay | Admitting: Radiology

## 2020-07-27 ENCOUNTER — Emergency Department (HOSPITAL_COMMUNITY)
Admission: EM | Admit: 2020-07-27 | Discharge: 2020-07-27 | Disposition: A | Discharge status: Home / Self Care | Payer: No Typology Code available for payment source | Attending: Emergency Medicine | Admitting: Emergency Medicine

## 2020-07-27 DIAGNOSIS — O26891 Other specified pregnancy related conditions, first trimester: Secondary | ICD-10-CM | POA: Insufficient documentation

## 2020-07-27 DIAGNOSIS — Z3A00 Weeks of gestation of pregnancy not specified: Secondary | ICD-10-CM | POA: Insufficient documentation

## 2020-07-27 DIAGNOSIS — E109 Type 1 diabetes mellitus without complications: Secondary | ICD-10-CM | POA: Insufficient documentation

## 2020-07-27 DIAGNOSIS — R519 Headache, unspecified: Secondary | ICD-10-CM | POA: Insufficient documentation

## 2020-07-27 DIAGNOSIS — J45909 Unspecified asthma, uncomplicated: Secondary | ICD-10-CM | POA: Insufficient documentation

## 2020-07-27 DIAGNOSIS — Z79899 Other long term (current) drug therapy: Secondary | ICD-10-CM | POA: Insufficient documentation

## 2020-07-27 DIAGNOSIS — Z794 Long term (current) use of insulin: Secondary | ICD-10-CM | POA: Insufficient documentation

## 2020-07-27 DIAGNOSIS — R102 Pelvic and perineal pain: Secondary | ICD-10-CM

## 2020-07-27 DIAGNOSIS — R1032 Left lower quadrant pain: Secondary | ICD-10-CM | POA: Insufficient documentation

## 2020-07-27 LAB — COMPREHENSIVE METABOLIC PANEL
ALT: 20 U/L (ref 0–44)
AST: 21 U/L (ref 15–41)
Albumin: 4 g/dL (ref 3.5–5.0)
Alkaline Phosphatase: 51 U/L (ref 38–126)
Anion gap: 9 (ref 5–15)
BUN: 9 mg/dL (ref 6–20)
CO2: 23 mmol/L (ref 22–32)
Calcium: 9.1 mg/dL (ref 8.9–10.3)
Chloride: 102 mmol/L (ref 98–111)
Creatinine, Ser: 0.74 mg/dL (ref 0.44–1.00)
GFR calc Af Amer: >60 mL/min (ref >60)
GFR calc non Af Amer: >60 mL/min (ref >60)
Glucose, Bld: 283 mg/dL — ABNORMAL HIGH (ref 70–99)
Potassium: 4 mmol/L (ref 3.5–5.1)
Sodium: 134 mmol/L — ABNORMAL LOW (ref 135–145)
Total Bilirubin: 0.7 mg/dL (ref 0.3–1.2)
Total Protein: 7.2 g/dL (ref 6.5–8.1)

## 2020-07-27 LAB — URINALYSIS, ROUTINE W REFLEX MICROSCOPIC
Bilirubin Urine: NEGATIVE
Glucose, UA: >=500 mg/dL — AB
Hgb urine dipstick: NEGATIVE
Ketones, ur: NEGATIVE mg/dL
Nitrite: NEGATIVE
Protein, ur: NEGATIVE mg/dL
Specific Gravity, Urine: 1.026 (ref 1.005–1.030)
pH: 5 (ref 5.0–8.0)

## 2020-07-27 LAB — CBC WITH DIFFERENTIAL/PLATELET
Abs Immature Granulocytes: 0.05 10*3/uL (ref 0.00–0.07)
Basophils Absolute: 0 10*3/uL (ref 0.0–0.1)
Basophils Relative: 0 %
Eosinophils Absolute: 0 10*3/uL (ref 0.0–0.5)
Eosinophils Relative: 0 %
HCT: 43.6 % (ref 36.0–46.0)
Hemoglobin: 14.3 g/dL (ref 12.0–15.0)
Immature Granulocytes: 0 %
Lymphocytes Relative: 6 %
Lymphs Abs: 0.8 10*3/uL (ref 0.7–4.0)
MCH: 29.2 pg (ref 26.0–34.0)
MCHC: 32.8 g/dL (ref 30.0–36.0)
MCV: 89 fL (ref 80.0–100.0)
Monocytes Absolute: 0.4 10*3/uL (ref 0.1–1.0)
Monocytes Relative: 3 %
Neutro Abs: 11.4 10*3/uL — ABNORMAL HIGH (ref 1.7–7.7)
Neutrophils Relative %: 91 %
Platelets: 245 10*3/uL (ref 150–400)
RBC: 4.9 MIL/uL (ref 3.87–5.11)
RDW: 11.8 % (ref 11.5–15.5)
WBC: 12.7 10*3/uL — ABNORMAL HIGH (ref 4.0–10.5)
nRBC: 0 % (ref 0.0–0.2)

## 2020-07-27 LAB — I-STAT VENOUS BLOOD GAS, ED
Acid-base deficit: 3 mmol/L — ABNORMAL HIGH (ref 0.0–2.0)
Bicarbonate: 24 mmol/L (ref 20.0–28.0)
Calcium, Ion: 1.21 mmol/L (ref 1.15–1.40)
HCT: 44 % (ref 36.0–46.0)
Hemoglobin: 15 g/dL (ref 12.0–15.0)
O2 Saturation: 72 %
Potassium: 4.1 mmol/L (ref 3.5–5.1)
Sodium: 137 mmol/L (ref 135–145)
TCO2: 25 mmol/L (ref 22–32)
pCO2, Ven: 48.2 mmHg (ref 44.0–60.0)
pH, Ven: 7.306 (ref 7.250–7.430)
pO2, Ven: 42 mmHg (ref 32.0–45.0)

## 2020-07-27 LAB — CBG MONITORING, ED
Glucose-Capillary: 221 mg/dL — ABNORMAL HIGH (ref 70–99)
Glucose-Capillary: 23 mg/dL — CL (ref 70–99)
Glucose-Capillary: 71 mg/dL (ref 70–99)

## 2020-07-27 LAB — HCG, QUANTITATIVE, PREGNANCY: hCG, Beta Chain, Quant, S: 1744 m[IU]/mL — ABNORMAL HIGH (ref <5)

## 2020-07-27 LAB — TYPE AND SCREEN
ABO/RH(D): B NEG
Antibody Screen: NEGATIVE

## 2020-07-27 LAB — LIPASE, BLOOD: Lipase: 28 U/L (ref 11–51)

## 2020-07-27 LAB — BETA-HYDROXYBUTYRIC ACID: Beta-Hydroxybutyric Acid: 0.12 mmol/L (ref 0.05–0.27)

## 2020-07-27 LAB — POC URINE PREG, ED: Preg Test, Ur: POSITIVE — AB

## 2020-07-27 MED ORDER — MAGNESIUM SULFATE IN D5W 1-5 GM/100ML-% IV SOLN
1.0000 g | Freq: Once | INTRAVENOUS | Status: AC
  Administered 2020-07-27: 1 g via INTRAVENOUS
  Filled 2020-07-27: qty 100

## 2020-07-27 MED ORDER — SODIUM CHLORIDE 0.9 % IV BOLUS
1000.0000 mL | Freq: Once | INTRAVENOUS | Status: AC
  Administered 2020-07-27: 1000 mL via INTRAVENOUS

## 2020-07-27 MED ORDER — METOCLOPRAMIDE HCL 5 MG/ML IJ SOLN
10.0000 mg | Freq: Once | INTRAMUSCULAR | Status: AC
  Administered 2020-07-27: 10 mg via INTRAVENOUS
  Filled 2020-07-27: qty 2

## 2020-07-27 MED ORDER — ACETAMINOPHEN 325 MG PO TABS
650.0000 mg | ORAL_TABLET | Freq: Once | ORAL | Status: AC
  Administered 2020-07-27: 650 mg via ORAL
  Filled 2020-07-27: qty 2

## 2020-07-27 MED ORDER — MORPHINE SULFATE (PF) 4 MG/ML IV SOLN
4.0000 mg | Freq: Once | INTRAVENOUS | Status: AC
  Administered 2020-07-27: 4 mg via INTRAVENOUS
  Filled 2020-07-27: qty 1

## 2020-07-27 NOTE — ED Notes (Signed)
Pt sitting up on stretcher alert and oriented.  Orange juice with sugar and graham crackers given

## 2020-07-27 NOTE — ED Triage Notes (Signed)
Pt. Stated, Im having headache, and left side pain . Im pregnant and Im a diabetic. Pt is anxious, crying in triage. Keeps saying I dont know what to say.

## 2020-07-27 NOTE — ED Provider Notes (Signed)
MSE was initiated and I personally evaluated the patient and placed orders (if any) at  11:52 AM on July 27, 2020.  The patient appears stable so that the remainder of the MSE may be completed by another provider.  Patient is a G2, P1 LMP 5 to 6 weeks ago who presents today for evaluation of multiple complaints.  She is a type I diabetic uses a insulin pump.  She is anxious, crying, tearful, keeps stating I do not know what words to use.  She is complaining of left-sided abdominal pain.  She has not had any prenatal care so far this pregnancy.  She reports that when she went to bed last night at about 11 PM she was normal however since waking up this morning at 8 she has been having difficulty with words.  She can me permission to speak with her fianc Sharlette Dense [phone].  He reports that when they went to bed last night she was normal and that he noted for having difficulty speaking and acting abnormal since they woke up this morning.  He states that the only time in the past he has seen anything similar was when her sugar was low.  Patient is able to tell me her name.  When I asked her where we are, what state we are in, what city we are in or what country we are and she tells me "I do not know the words to use."  She is able to name objects such as a phone in a pen.  When I asked her if we are in [place] or [place] she is able to correctly answer that we are in [place].  5/5 strength bilateral arms and legs.  No pronator drift.  Patient checked her blood sugar using her own glucometer in triage, sugar is 356.  She then gave herself bolus insulin using her insulin pump.  The pump calculated insulin given based on her hyperglycemia.  In summary her primary concerns are left-sided abdominal pain, vaginal spotting and bleeding, confusion, and headache.  She states she is vaccinated against Covid.  Given her difficulty answering simple questions such as what street she lives  on and what state we are in, combined with her hypertension, and headache, I do not think she is appropriate for MAU at this time.  She has not had any ultrasound so far this pregnancy and is concerned that she may have an ectopic.  Orders for labs including DKA type labs, abd pain labs and type and screen placed.   CT head ordered.  Her speech is not slurred.  She is outside any stroke window as she does not meet LVO criteria currently and her last seen normal was at about 11 last night.  Triage RN is made aware.  Patient is moved to green.  I spoke with Dr. Charm Barges in green zone regarding this patient.  Note: Portions of this report may have been transcribed using voice recognition software. Every effort was made to ensure accuracy; however, inadvertent computerized transcription errors may be present         Norman Clay 07/27/20 1159    Terrilee Files, MD 07/27/20 [phone]

## 2020-07-27 NOTE — Consult Note (Signed)
Neurology Consultation  Reason for Consult: Headaches, confusion Referring Physician: Dr. Meridee Score, ER  CC: Headache, confusion  History is obtained from: Patient  HPI: Margaret Conner is a 29 y.o. female past medical history of type 1 diabetes, anxiety, bipolar disorder, currently 5 to [redacted] weeks pregnant, presented to the emergency room for evaluation of severe headaches that started upon waking up this morning.  She went to bed normally last night, woke up this morning with severe headache on the front of her head bilaterally.  The headache was sharp in nature and had no true relieving or exacerbating factors.  She also felt that she was confused, and had difficult time communicating-felt her words were not coming out correctly. Reports nausea but has been nauseous because of her pregnancy so cannot say whether the headache was the only thing associated with the nausea.  No vomiting.  Reports photophobia but no frank phonophobia. Medically screened by the ED provider upon arrival.  Noncontrast head CT done-unremarkable. On-call neurology consulted in the daytime for further imaging recommendations.  MRI brain without contrast and MRV head to rule out dural venous sinus thrombosis recommended. MR studies completed.  MRI brain with no abnormality. 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  Left jugular not well visualized.  Could be congenital occlusion or prior venous sinus thrombosis which is recanalized.  Reports off-and-on chest pain in the left chest.  Reports back pain since yesterday or this morning.  Reports increased anxiety due to life events-job as well as recent discovery of being pregnant.  Has a history of migraines as a young adult but has not had very frequent migraines.   ROS: Performed and negative except as noted in HPI. Past Medical History:  Diagnosis Date  . Asthma   . Diabetes mellitus   Anxiety Bipolar  disorder  Family History  Problem Relation Age of Onset  . Diabetes Mellitus II Neg Hx     Social History:   reports that she has never smoked. She has never used smokeless tobacco. She reports current alcohol use. She reports that she does not use drugs.   Medications  Current Facility-Administered Medications:  .  magnesium sulfate IVPB 1 g 100 mL, 1 g, Intravenous, Once, Terrilee Files, MD, Last Rate: 100 mL/hr at 07/27/20 1951, 1 g at 07/27/20 1951  Current Outpatient Medications:  .  CRANBERRY PO, Take 1 tablet by mouth daily., Disp: , Rfl:  .  diclofenac sodium (VOLTAREN) 1 % GEL, Apply 2 g topically 4 (four) times daily as needed. (Patient not taking: Reported on 04/03/2020), Disp: , Rfl:  .  HYDROcodone-acetaminophen (NORCO/VICODIN) 5-325 MG tablet, Take 1 tablet by mouth every 6 (six) hours as needed for severe pain., Disp: 10 tablet, Rfl: 0 .  ibuprofen (ADVIL) 800 MG tablet, Take 1 tablet (800 mg total) by mouth every 8 (eight) hours as needed for moderate pain., Disp: 21 tablet, Rfl: 0 .  Insulin Human (INSULIN PUMP) SOLN, Inject 1 each into the skin continuous. Pump uses Novolin-R, Disp: , Rfl:  .  Multiple Vitamin (MULTIVITAMIN ADULT PO), Take 1 tablet by mouth daily., Disp: , Rfl:  .  Omega-3 Fatty Acids (FISH OIL PO), Take 1 capsule by mouth daily., Disp: , Rfl:  .  ondansetron (ZOFRAN) 4 MG tablet, Take 1 tablet (4 mg total) by mouth every 6 (six) hours as needed for nausea. (Patient not taking: Reported on 04/03/2020), Disp: 20 tablet, Rfl: 0 .  VITAMIN D,  CHOLECALCIFEROL, PO, Take 1 tablet by mouth daily., Disp: , Rfl:    Exam: Current vital signs: BP 117/65 (BP Location: Right Arm)   Pulse 77   Temp 98.8 F (37.1 C)   Resp 16   Ht 5\' 6"  (1.676 m)   Wt 77.1 kg   SpO2 100%   BMI 27.44 kg/m  Vital signs in last 24 hours: Temp:  [98.8 F (37.1 C)] 98.8 F (37.1 C) (08/28 1113) Pulse Rate:  [71-99] 77 (08/28 1955) Resp:  [16-18] 16 (08/28 1955) BP:  (117-158)/(65-96) 117/65 (08/28 1955) SpO2:  [99 %-100 %] 100 % (08/28 1955) Weight:  [77.1 kg] 77.1 kg (08/28 1954) GENERAL: Awake, alert, no gross distress, but does endorse some anxiety and discomfort due to headache. HEENT: - Normocephalic and atraumatic, dry mm, no LN++, no Thyromegally.  Fundus not well visualized but no frank signs of papilledema seen on limited funduscopic exam. LUNGS - Clear to auscultation bilaterally with no wheezes CV - S1S2 RRR, no m/r/g, equal pulses bilaterally. ABDOMEN - Soft, nontender, nondistended with normoactive BS Ext: warm, well perfused, intact peripheral pulses, no edema  NEURO:  Mental Status: AA&Ox3  Language: speech is   Naming, repetition, fluency, and comprehension intact. Cranial Nerves: PERRL-difficult to examine eyes due to light bothering her EOMI with some amount of increase in headache on extreme gaze in both directions, visual fields full, no facial asymmetry, facial sensation intact, hearing intact, tongue/uvula/soft palate midline, normal sternocleidomastoid and trapezius muscle strength. No evidence of tongue atrophy or fibrillations Motor: 5/5 with no vertical drift in any of the 4 extremities.  Normal range of motion. Tone: is normal and bulk is normal Sensation- Intact to light touch bilaterally, with no extinction on double simultaneous stimulation Coordination: FTN intact bilaterally, no ataxia in BLE. Gait- deferred  NIHSS-0   Labs I have reviewed labs in epic and the results pertinent to this consultation are:  CBC    Component Value Date/Time   WBC 12.7 (H) 07/27/2020 1238   RBC 4.90 07/27/2020 1238   HGB 15.0 07/27/2020 1256   HCT 44.0 07/27/2020 1256   PLT 245 07/27/2020 1238   MCV 89.0 07/27/2020 1238   MCH 29.2 07/27/2020 1238   MCHC 32.8 07/27/2020 1238   RDW 11.8 07/27/2020 1238   LYMPHSABS 0.8 07/27/2020 1238   MONOABS 0.4 07/27/2020 1238   EOSABS 0.0 07/27/2020 1238   BASOSABS 0.0 07/27/2020 1238     CMP     Component Value Date/Time   NA 137 07/27/2020 1256   K 4.1 07/27/2020 1256   CL 102 07/27/2020 1238   CO2 23 07/27/2020 1238   GLUCOSE 283 (H) 07/27/2020 1238   BUN 9 07/27/2020 1238   CREATININE 0.74 07/27/2020 1238   CALCIUM 9.1 07/27/2020 1238   PROT 7.2 07/27/2020 1238   ALBUMIN 4.0 07/27/2020 1238   AST 21 07/27/2020 1238   ALT 20 07/27/2020 1238   ALKPHOS 51 07/27/2020 1238   BILITOT 0.7 07/27/2020 1238   GFRNONAA >60 07/27/2020 1238   GFRAA >60 07/27/2020 1238  Urinalysis with small leuk esterase, few bacteria and few white cells.  Greater than 500 sugars.  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  Left jugular not well visualized.  Could be congenital occlusion or prior venous sinus thrombosis which is recanalized.  Assessment:  29 year old woman with  type 1 diabetes, anxiety, bipolar disorder, currently 5 to [redacted] weeks pregnant, coming in with sharp headaches which are bifrontal, with no real exacerbating or relieving factors, with minimal response to morphine and Reglan, headaches associated with some nausea and photophobia. MRI brain normal. 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  Left jugular not well visualized.  Could be congenital occlusion of prior venous sinus thrombosis which is cannulized. Not concerning for an acute dural venous sinus thrombosis. MRI not consistent with I IHT and limited funduscopy also shortening. No visual symptoms reported as well.  Headache likely multifactorial-possible migraine plus minus tension headaches.  Recommendations: Magnesium sulfate infusion Normal saline bolus 1 L Reassess after fluids and mag. If headache improved, can be discharged home I would recommend outpatient follow-up in the next few  months with Dr. Lenord Carbo at [facility] neurology next. Continue OB/GYN care as planned.  Discussed the plan in detail with the patient, and fianc at bedside. Also discussed the plan in detail with Dr. Charm Barges.   Please call neurology with questions as needed.  -- Milon Dikes, MD [facility] Pager: [phone] If 7pm to 7am, please call on call as listed on AMION.

## 2020-07-27 NOTE — ED Notes (Signed)
Pt states that her headache had decreased from an 10 to an 8 but states she is still in pain

## 2020-07-27 NOTE — ED Provider Notes (Signed)
[facility] EMERGENCY DEPARTMENT Provider Note   CSN: [id number] Arrival date & time: 07/27/20  1008     History Chief Complaint  Patient presents with  . Headache  . Flank Pain  . Routine Prenatal Visit    Margaret Conner is a 29 y.o. female.  She is a positive home pregnancy test and thinks she may be about [redacted] weeks pregnant.  Went to bed last night at 11 PM without any complaints.  Sometime overnight started experiencing a headache frontal mild generalized along with some left lower quadrant pain that radiates into her back. No spotting/bleeding. She said she gets headaches but never had one like this before.  Radiates into her neck.  Severe in nature.  She also said her blood sugar rose up to 300 today.  She feels she is having difficulty finding words and she states she is confused.  No numbness or weakness.  No blurry vision double vision.  No recent medication changes.  The history is provided by the patient.  Headache Pain location:  Generalized Quality:  Dull Radiates to:  L neck and R neck Severity currently:  10/10 Onset quality:  Gradual Timing:  Constant Progression:  Worsening Chronicity:  New Similar to prior headaches: no   Relieved by:  Nothing Worsened by:  Nothing Ineffective treatments:  None tried Associated symptoms: abdominal pain and neck pain   Associated symptoms: no blurred vision, no eye pain, no facial pain, no fever, no focal weakness, no loss of balance, no neck stiffness, no sore throat and no vomiting   Abdominal pain:    Location:  LLQ   Quality: aching     Severity:  Moderate   Onset quality:  Gradual   Timing:  Constant   Progression:  Worsening   Chronicity:  New Flank Pain Associated symptoms include abdominal pain and headaches. Pertinent negatives include no chest pain and no shortness of breath.       Past Medical History:  Diagnosis Date  . Asthma   . Diabetes mellitus     Patient Active Problem List    Diagnosis Date Noted  . Pyelonephritis 05/24/2019  . Diabetes mellitus 02/01/2019  . Type 1 diabetes (HCC) 02/01/2019    History reviewed. No pertinent surgical history.   OB History   No obstetric history on file.     Family History  Problem Relation Age of Onset  . Diabetes Mellitus II Neg Hx     Social History   Tobacco Use  . Smoking status: Never Smoker  . Smokeless tobacco: Never Used  Substance Use Topics  . Alcohol use: Yes  . Drug use: No    Home Medications Prior to Admission medications   Medication Sig Start Date End Date Taking? Authorizing Provider  CRANBERRY PO Take 1 tablet by mouth daily.    [provider]  diclofenac sodium (VOLTAREN) 1 % GEL Apply 2 g topically 4 (four) times daily as needed. Patient not taking: Reported on 04/03/2020 05/26/19   Edsel Petrin, DO  HYDROcodone-acetaminophen (NORCO/VICODIN) 5-325 MG tablet Take 1 tablet by mouth every 6 (six) hours as needed for severe pain. 04/03/20   Long, Arlyss Repress, MD  ibuprofen (ADVIL) 800 MG tablet Take 1 tablet (800 mg total) by mouth every 8 (eight) hours as needed for moderate pain. 04/03/20   Long, Arlyss Repress, MD  Insulin Human (INSULIN PUMP) SOLN Inject 1 each into the skin continuous. Pump uses Novolin-R    [provider]  Multiple Vitamin (MULTIVITAMIN ADULT PO) Take 1 tablet by mouth daily.    [provider]  Omega-3 Fatty Acids (FISH OIL PO) Take 1 capsule by mouth daily.    [provider]  ondansetron (ZOFRAN) 4 MG tablet Take 1 tablet (4 mg total) by mouth every 6 (six) hours as needed for nausea. Patient not taking: Reported on 04/03/2020 05/26/19   Edsel Petrin, DO  VITAMIN D, CHOLECALCIFEROL, PO Take 1 tablet by mouth daily.    [provider]    Allergies    Penicillins and Cephalexin  Review of Systems   Review of Systems  Constitutional: Negative for fever.  HENT: Negative for sore throat.   Eyes: Negative for blurred vision and  pain.  Respiratory: Negative for shortness of breath.   Cardiovascular: Negative for chest pain.  Gastrointestinal: Positive for abdominal pain. Negative for vomiting.  Genitourinary: Positive for flank pain. Negative for dysuria.  Musculoskeletal: Positive for neck pain. Negative for neck stiffness.  Skin: Negative for rash.  Neurological: Positive for speech difficulty and headaches. Negative for focal weakness and loss of balance.  Psychiatric/Behavioral: Positive for confusion.    Physical Exam Updated Vital Signs BP (!) 158/96 (BP Location: Left Arm)   Pulse 99   Temp 98.8 F (37.1 C)   Resp 17   SpO2 99%   Physical Exam Vitals and nursing note reviewed.  Constitutional:      General: She is not in acute distress.    Appearance: Normal appearance. She is well-developed.  HENT:     Head: Normocephalic and atraumatic.  Eyes:     Extraocular Movements: Extraocular movements intact.     Conjunctiva/sclera: Conjunctivae normal.     Pupils: Pupils are equal, round, and reactive to light.  Cardiovascular:     Rate and Rhythm: Normal rate and regular rhythm.     Heart sounds: No murmur heard.   Pulmonary:     Effort: Pulmonary effort is normal. No respiratory distress.     Breath sounds: Normal breath sounds.  Abdominal:     Palpations: Abdomen is soft. There is no mass.     Tenderness: There is no abdominal tenderness. There is no guarding.  Musculoskeletal:        General: Normal range of motion.     Cervical back: Neck supple.  Skin:    General: Skin is warm and dry.     Capillary Refill: Capillary refill takes less than 2 seconds.  Neurological:     General: No focal deficit present.     Mental Status: She is alert.     GCS: GCS eye subscore is 4. GCS verbal subscore is 5. GCS motor subscore is 6.     Cranial Nerves: No cranial nerve deficit or dysarthria.     Sensory: No sensory deficit.     Motor: No weakness.     ED Results / Procedures / Treatments     Labs (all labs ordered are listed, but only abnormal results are displayed) Labs Reviewed  URINALYSIS, ROUTINE W REFLEX MICROSCOPIC - Abnormal; Notable for the following components:      Result Value   APPearance HAZY (*)    Glucose, UA >=500 (*)    Leukocytes,Ua SMALL (*)    Bacteria, UA FEW (*)    All other components within normal limits  CBC WITH DIFFERENTIAL/PLATELET - Abnormal; Notable for the following components:   WBC 12.7 (*)    Neutro Abs 11.4 (*)    All other  components within normal limits  COMPREHENSIVE METABOLIC PANEL - Abnormal; Notable for the following components:   Sodium 134 (*)    Glucose, Bld 283 (*)    All other components within normal limits  HCG, QUANTITATIVE, PREGNANCY - Abnormal; Notable for the following components:   hCG, Beta Chain, Quant, S 1,744 (*)    All other components within normal limits  POC URINE PREG, ED - Abnormal; Notable for the following components:   Preg Test, Ur POSITIVE (*)    All other components within normal limits  I-STAT VENOUS BLOOD GAS, ED - Abnormal; Notable for the following components:   Acid-base deficit 3.0 (*)    All other components within normal limits  CBG MONITORING, ED - Abnormal; Notable for the following components:   Glucose-Capillary 221 (*)    All other components within normal limits  CBG MONITORING, ED - Abnormal; Notable for the following components:   Glucose-Capillary 23 (*)    All other components within normal limits  BETA-HYDROXYBUTYRIC ACID  LIPASE, BLOOD  BLOOD GAS, VENOUS  CBG MONITORING, ED  TYPE AND SCREEN    EKG None  Radiology CT Head Wo Contrast  Result Date: 07/27/2020 CLINICAL DATA:  Pt states she woke up this morning with a headache, pain was mostly in the front of her head. EXAM: CT HEAD WITHOUT CONTRAST TECHNIQUE: Contiguous axial images were obtained from the base of the skull through the vertex without intravenous contrast. COMPARISON:  None. FINDINGS: Brain: No evidence of  acute infarction, hemorrhage, hydrocephalus, extra-axial collection or mass lesion/mass effect. Vascular: No hyperdense vessel or unexpected calcification. Skull: Normal. Negative for fracture or focal lesion. Sinuses/Orbits: No acute finding. Other: None. IMPRESSION: No acute intracranial pathology. Electronically Signed   By: Emmaline KluverNancy  Ballantyne M.D.   On: 07/27/2020 13:02   MR BRAIN WO CONTRAST  Result Date: 07/27/2020 CLINICAL DATA:  Headache. Positive pregnancy test. Insulin-dependent diabetic. EXAM: MRI HEAD WITHOUT CONTRAST MRV HEAD WITHOUT CONTRAST TECHNIQUE: Multiplanar, multiecho pulse sequences of the brain and surrounding structures were obtained without intravenous contrast. Angiographic images of the intracranial venous structures were obtained using MRV technique without intravenous contrast. COMPARISON:  CT head 07/27/2020 FINDINGS: Ventricle size and cerebral volume normal. Negative for infarct, hemorrhage, mass. Normal white matter without evidence of demyelinating disease. Pituitary normal. Normal arterial flow voids Negative calvarium. Mild mucosal edema paranasal sinuses. Negative orbit. MR venogram head: Superior sagittal sinus normal. Internal cerebral veins and straight sinus patent. Dominant right transverse sinus widely patent. Right sigmoid sinus and jugular vein widely patent on the right. Hypoplastic left transverse sinus proximally with reconstitution of the left transverse sinus distally. Left sigmoid sinus is small. This drains into small veins in the neck. Well-defined jugular vein on the left is not visualized. IMPRESSION: Normal MRI head Hypoplastic left transverse sinus which reconstitutes in the midportion. Left jugular vein not well visualized. This could be due to congenital occlusion or prior venous thrombosis which has recanalized. Electronically Signed   By: Marlan Palauharles  Clark M.D.   On: 07/27/2020 17:49   MR Venogram Head  Result Date: 07/27/2020 CLINICAL DATA:  Headache.  Positive pregnancy test. Insulin-dependent diabetic. EXAM: MRI HEAD WITHOUT CONTRAST MRV HEAD WITHOUT CONTRAST TECHNIQUE: Multiplanar, multiecho pulse sequences of the brain and surrounding structures were obtained without intravenous contrast. Angiographic images of the intracranial venous structures were obtained using MRV technique without intravenous contrast. COMPARISON:  CT head 07/27/2020 FINDINGS: Ventricle size and cerebral volume normal. Negative for infarct, hemorrhage, mass. Normal white matter without  evidence of demyelinating disease. Pituitary normal. Normal arterial flow voids Negative calvarium. Mild mucosal edema paranasal sinuses. Negative orbit. MR venogram head: Superior sagittal sinus normal. Internal cerebral veins and straight sinus patent. Dominant right transverse sinus widely patent. Right sigmoid sinus and jugular vein widely patent on the right. Hypoplastic left transverse sinus proximally with reconstitution of the left transverse sinus distally. Left sigmoid sinus is small. This drains into small veins in the neck. Well-defined jugular vein on the left is not visualized. IMPRESSION: Normal MRI head Hypoplastic left transverse sinus which reconstitutes in the midportion. Left jugular vein not well visualized. This could be due to congenital occlusion or prior venous thrombosis which has recanalized. Electronically Signed   By: Marlan Palau M.D.   On: 07/27/2020 17:49   US OB LESS THAN 14 WEEKS WITH OB TRANSVAGINAL  Result Date: 07/27/2020 CLINICAL DATA:  Pregnant patient with pelvic pain and vaginal bleeding. Unknown last menstrual. Beta HCG 1,744 EXAM: OBSTETRIC <14 WK [place] AND TRANSVAGINAL OB US TECHNIQUE: Both transabdominal and transvaginal ultrasound examinations were performed for complete evaluation of the gestation as well as the maternal uterus, adnexal regions, and pelvic cul-de-sac. Transvaginal technique was performed to assess early pregnancy. COMPARISON:  None.  FINDINGS: Intrauterine gestational sac: No definite intrauterine gestational sac. Yolk sac:  Not Visualized. Embryo:  Not Visualized. Cardiac Activity: Not Visualized. Maternal uterus/adnexae: The endometrium is thickened measuring 16 mm. Two adjacent cystic foci in the endometrial canal each measure less than 2 mm. This may represent an early gestational sac with adjacent hemorrhage, and a small amount of fluid is seen within the fundal aspect in the endometrium. The right ovary is normal and demonstrates blood flow. Corpus luteal cyst in the left ovary measures 2.2 cm with peripheral vascularity. No adnexal mass or pelvic free fluid. IMPRESSION: 1. No definite intrauterine gestation. The endometrium is thickened. Within the thickened endometrium are two small fluid structures both measuring less than 2 mm, as well as a small amount of fluid in the distal endometrial canal at the fundus. This may represent endometrial fluid versus an early gestational sac with adjacent subchorionic hemorrhage, but is nonspecific given small size. Recommend follow-up quantitative B-HCG levels and follow-up US in 10-14 days to assess viability. This recommendation follows SRU consensus guidelines: Diagnostic Criteria for Nonviable Pregnancy Early in the First Trimester. Malva Limes Med 2013; 536:1443-15. 2. No evidence of ectopic pregnancy. 3. Corpus luteal cyst in the left ovary. Electronically Signed   By: Narda Rutherford M.D.   On: 07/27/2020 16:13    Procedures Procedures (including critical care time)  Medications Ordered in ED Medications  morphine 4 MG/ML injection 4 mg (4 mg Intravenous Given 07/27/20 1327)  metoCLOPramide (REGLAN) injection 10 mg (10 mg Intravenous Given 07/27/20 1324)  sodium chloride 0.9 % bolus 1,000 mL (0 mLs Intravenous Stopped 07/27/20 1423)  magnesium sulfate IVPB 1 g 100 mL (0 g Intravenous Stopped 07/27/20 2103)  acetaminophen (TYLENOL) tablet 650 mg (650 mg Oral Given 07/27/20 2033)    ED  Course  I have reviewed the triage vital signs and the nursing notes.  Pertinent labs & imaging results that were available during my care of the patient were reviewed by me and considered in my medical decision making (see chart for details).  Clinical Course as of Jul 27 2226  Sat Jul 27, 2020  1126 365, patient gave her self insulin with her pump.    [EH]  1522 Discussed with neurology Dr. Otelia Limes.  He  reviewed her head CT and recommended getting an MRI MRV brain without contrast.   [MB]  1746 Patient returned from MRI and stated that she thinks her blood sugar is low.  She checked it on her own she said it was 71.  We will recheck her sugar and treat if necessary.  Awake and alert.   [MB]  1836 Reassessed patient, she said she feels a little bit better with a headache now rates it as 8 out of 10.  She also feels her ability to communicate is somewhat better although still doesn't feel back to baseline.  Reviewed this with Dr. Otelia Limes neurology.  He will have somebody consult on her.  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   [MB]  1931 Discussed with oncoming neurologist Dr. Wilford Corner who will see the patient in the ED.   [MB]  1955 Patient seen by Dr. Wilford Corner.  He feels this is more migrainous process and does not think she needs any further work-up.  Agrees with current plan of magnesium and give her more fluids.  If she is not feeling better he asked that we give him another call back.  Dissipate she can follow-up as an outpatient as needed.   [MB]  2049 Reevaluated patient, she says she is feeling a little bit better.  She feels her confusion is much improved.  I reviewed Dr. Roxine Caddy recommendations with her and she is comfortable plan for outpatient follow-up with neurology.  She will return if any worsening symptoms.   [MB]    Clinical Course User Index [EH] Cristina Gong, PA-C [MB] Terrilee Files, MD   MDM Rules/Calculators/A&P                          This patient complains of headache word finding difficulties left lower quadrant abdominal pain; this involves an extensive number of treatment Options and is a complaint that carries with it a high risk of complications and Morbidity. The differential includes stroke, seizure, venous thrombosis, migraine, tension headache, ectopic pregnancy  I ordered, reviewed and interpreted labs, which included CBC with mildly elevated white count, normal hemoglobin, chemistries with elevated glucose, urinalysis equivocal for signs of infection, hCG elevated, pH normal, beta hydroxybutyrate normal indicating not in DKA I ordered medication IV fluids Reglan morphine Tylenol magnesium I ordered imaging studies which included CT head, MRI MRV and I independently    visualized and interpreted imaging which showed no acute findings.  Radiology comments upon some asymmetry of her venous collecting system question congenital versus chronic clot with cannulization Additional history obtained from patient significant other Previous records obtained and reviewed in epic, no recent admissions I consulted neurology Dr. Otelia Limes and Dr. Wilford Corner and discussed lab and imaging findings  Critical Interventions: None  After the interventions stated above, I reevaluated the patient and found patient's headache to be improving and speech difficulties also improved.  Left lower quadrant abdominal pain has resolved.  There was no bleeding and do not think needs RhoGam due to this and very low quant.  She has an appointment with her OB tomorrow.  They can follow-up with her OB complaint.  Neurology recommending outpatient neurology follow-up.  Patient in agreement with plan.  Strict return instructions discussed.   Final Clinical Impression(s) / ED Diagnoses Final diagnoses:  Generalized headache  Abdominal pain during pregnancy in first trimester    Rx / DC Orders  ED Discharge Orders    None       Terrilee Files, MD 07/27/20 2234

## 2020-07-27 NOTE — ED Notes (Signed)
Pt ambulated to bathroom with even and steady gait. Tolerated well.

## 2020-07-27 NOTE — Discharge Instructions (Signed)
You are seen in the emergency department for evaluation of headache, increased difficulty with communicating and understanding, and lower abdominal pain in the setting of early pregnancy.  You had a CAT scan of your head along with an MRI that did not show an obvious explanation for your symptoms.  He also had an ultrasound of your pelvis which did not show a definitive pregnancy, likely due to the early nature of your pregnancy.  This will require follow-up with blood work and repeat ultrasound by your OB.  Please contact them for close follow-up.

## 2020-07-27 NOTE — ED Notes (Signed)
Introduced myself to pt, pt is resting comfortably and req pain meds for HA rated 8/10. SO at bedside with pt. No other complaints at this time.

## 2020-07-27 NOTE — ED Notes (Signed)
Patient transported to CT 

## 2020-10-22 ENCOUNTER — Ambulatory Visit: Payer: No Typology Code available for payment source | Admitting: Neurology

## 2020-10-22 ENCOUNTER — Encounter: Payer: Self-pay | Admitting: Neurology

## 2020-10-23 ENCOUNTER — Telehealth: Payer: Self-pay | Admitting: Neurology

## 2020-10-23 NOTE — Telephone Encounter (Signed)
Patient has no-showed new appointments, reviewing chart

## 2020-10-23 NOTE — Telephone Encounter (Signed)
This is her only no-show at [facility] so far.

## 2020-12-12 ENCOUNTER — Ambulatory Visit (INDEPENDENT_AMBULATORY_CARE_PROVIDER_SITE_OTHER): Payer: No Typology Code available for payment source | Admitting: Psychology

## 2020-12-12 DIAGNOSIS — F319 Bipolar disorder, unspecified: Secondary | ICD-10-CM

## 2020-12-19 DIAGNOSIS — Z0289 Encounter for other administrative examinations: Secondary | ICD-10-CM

## 2020-12-26 ENCOUNTER — Ambulatory Visit (INDEPENDENT_AMBULATORY_CARE_PROVIDER_SITE_OTHER): Payer: No Typology Code available for payment source | Admitting: Psychology

## 2020-12-26 DIAGNOSIS — F319 Bipolar disorder, unspecified: Secondary | ICD-10-CM | POA: Diagnosis not present

## 2021-01-02 ENCOUNTER — Ambulatory Visit (INDEPENDENT_AMBULATORY_CARE_PROVIDER_SITE_OTHER): Payer: No Typology Code available for payment source | Admitting: Psychology

## 2021-01-02 DIAGNOSIS — F319 Bipolar disorder, unspecified: Secondary | ICD-10-CM

## 2021-01-09 ENCOUNTER — Ambulatory Visit (INDEPENDENT_AMBULATORY_CARE_PROVIDER_SITE_OTHER): Payer: No Typology Code available for payment source | Admitting: Psychology

## 2021-01-09 DIAGNOSIS — F319 Bipolar disorder, unspecified: Secondary | ICD-10-CM | POA: Diagnosis not present

## 2021-01-16 ENCOUNTER — Ambulatory Visit (INDEPENDENT_AMBULATORY_CARE_PROVIDER_SITE_OTHER): Payer: No Typology Code available for payment source | Admitting: Psychology

## 2021-01-16 DIAGNOSIS — F319 Bipolar disorder, unspecified: Secondary | ICD-10-CM | POA: Diagnosis not present

## 2021-01-23 ENCOUNTER — Ambulatory Visit (INDEPENDENT_AMBULATORY_CARE_PROVIDER_SITE_OTHER): Payer: No Typology Code available for payment source | Admitting: Psychology

## 2021-01-23 DIAGNOSIS — F319 Bipolar disorder, unspecified: Secondary | ICD-10-CM | POA: Diagnosis not present

## 2021-02-06 ENCOUNTER — Ambulatory Visit (INDEPENDENT_AMBULATORY_CARE_PROVIDER_SITE_OTHER): Payer: No Typology Code available for payment source | Admitting: Psychology

## 2021-02-06 DIAGNOSIS — F319 Bipolar disorder, unspecified: Secondary | ICD-10-CM

## 2021-02-13 ENCOUNTER — Ambulatory Visit (INDEPENDENT_AMBULATORY_CARE_PROVIDER_SITE_OTHER): Payer: No Typology Code available for payment source | Admitting: Psychology

## 2021-02-13 DIAGNOSIS — F319 Bipolar disorder, unspecified: Secondary | ICD-10-CM

## 2021-02-20 ENCOUNTER — Ambulatory Visit (INDEPENDENT_AMBULATORY_CARE_PROVIDER_SITE_OTHER): Payer: No Typology Code available for payment source | Admitting: Neurology

## 2021-02-20 ENCOUNTER — Encounter: Payer: Self-pay | Admitting: Neurology

## 2021-02-20 ENCOUNTER — Ambulatory Visit: Payer: No Typology Code available for payment source | Admitting: Psychology

## 2021-02-20 VITALS — BP 129/86 | HR 77 | Ht 66.0 in | Wt 210.0 lb

## 2021-02-20 DIAGNOSIS — G43009 Migraine without aura, not intractable, without status migrainosus: Secondary | ICD-10-CM | POA: Diagnosis not present

## 2021-02-20 MED ORDER — NORTRIPTYLINE HCL 10 MG PO CAPS: 10.0000 mg | ORAL_CAPSULE | Freq: Every day | ORAL | 3 refills | Status: AC

## 2021-02-20 MED ORDER — PROPRANOLOL HCL 20 MG PO TABS: 20.0000 mg | ORAL_TABLET | Freq: Two times a day (BID) | ORAL | 6 refills | Status: DC

## 2021-02-20 MED ORDER — METOCLOPRAMIDE HCL 10 MG PO TABS: 10.0000 mg | ORAL_TABLET | Freq: Four times a day (QID) | ORAL | 2 refills | Status: AC | PRN

## 2021-02-20 NOTE — Progress Notes (Signed)
[facility]    Provider:  Dr Lucia GaskinsAhern Requesting Provider: Terrilee FilesButler, Michael C, MD Primary Care Provider:  Pcp, No  CC:  migraines  HPI:  Margaret Conner is a 30 y.o. female here as requested by Terrilee FilesButler, Michael C, MD for generalized headache.  Past medical history diabetes.  Patient was referred to asked last August in 2021 after an emergency room visit.  She had an appointment in November 2021 which she no showed.  She is here today.  She reports she has no primary care physician.  I reviewed her emergency room notes from last August 2021, at the time she was pregnant about 5 weeks, sometime the previous evening she started experiencing a headache frontal mild generalized along with some left lower quadrant pain that radiated into her back, she said that she gets headaches but never had one like this before, radiated into her neck, severe nature, she also said her blood sugar rose up to 300 same day, difficulty finding words and states she is confused, no numbness or weakness, no blurry vision or double vision, no recent medication changes.  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   Patient is here alone today, she reports that she has a headache, she is pregnant and estimated delivery is at the end of April, she has a history of headaches but in the past 2 years or so she is had migraines, recently was in the hospital for migraine and she lost her ability to speak, she is a type I diabetic and wondered if her blood  sugar was low but it was not, Flexeril takes the edge off but not very helpful, ibuprofen the same, has had 1 migraine a month since the hospital visit but still gets headaches often.Her headaches are frequent, she has been suffering through her headache, tylenol not helping, flexeril not helping, headaches are discomfort across her whole head or tension in her neck. Migraines are unilateral, nausea, photophobia/osmophobia, smells trigger, pulsating/pounding/throbbing, once a month. Headaches are 2-3x a week. And they can cause some nausea. When not pregnant ibuprofen and caffeine help. Now she is limited. Peppermint oil doesn't help. Sertraline helping her anxiety. No other focal neurologic deficits, associated symptoms, inciting events or modifiable factors.  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 This could be due to congenital occlusion or prior venous thrombosis which has recanalized.  Review of Systems: Patient complains of symptoms per HPI as well as the following symptoms: anxiety. Pertinent negatives and positives per HPI. All others negative.   Social History   Socioeconomic History  . Marital status: Married    Spouse name: Not on file  . Number of children: Not on file  .  Years of education: Not on file  . Highest education level: Not on file  Occupational History  . Not on file  Tobacco Use  . Smoking status: Former Smoker    Types: Cigarettes    Quit date: 2018    Years since quitting: 4.2  . Smokeless tobacco: Never Used  Vaping  Use  . Vaping Use: Never used  Substance and Sexual Activity  . 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 Drug use: No  . Sexual activity: Not on file  Other Topics Concern  . Not on file  Social History Narrative   Lives at home with husband   Right handed   Caffeine: maybe 2 cans of coca cola per week   Social Determinants of Health   Financial Resource Strain: Not on file  Food Insecurity: Not on file  Transportation Needs: Not on file  Physical Activity: Not on file  Stress: Not on file  Social Connections: Not on file  Intimate Partner Violence: Not on file    Family History  Problem Relation Age of Onset  . Headache Mother   . High Cholesterol Other   . High blood pressure Other   . Cancer Other        both sides of family   . Diabetes Mellitus II Neg Hx     Past Medical History:  Diagnosis Date  . Asthma   . Diabetes mellitus   . Migraine     Patient Active Problem List   Diagnosis Date Noted  . Migraine without aura and without status migrainosus, not intractable 02/20/2021  . Pyelonephritis 05/24/2019  . Diabetes mellitus 02/01/2019  . Type 1 diabetes (HCC) 02/01/2019    Past Surgical History:  Procedure Laterality Date  . NO PAST SURGERIES      Current Outpatient Medications  Medication Sig Dispense Refill  . aspirin EC 81 MG tablet Take 81 mg by mouth daily. Swallow whole.    . cyclobenzaprine (FLEXERIL) 10 MG tablet Take 10 mg by mouth every 6 (six) hours as needed.    . Insulin Human (INSULIN PUMP) SOLN Inject 1 each into the skin continuous. Pump uses Novolin-R    . metoCLOPramide (REGLAN) 10 MG tablet Take 1 tablet (10 mg total) by mouth 4 (four) times daily as needed for nausea. 60 tablet 2  . NIFEdipine (PROCARDIA XL/NIFEDICAL XL) 60 MG 24 hr tablet Take 60 mg by mouth daily.    . nortriptyline (PAMELOR) 10 MG capsule Take 1 capsule (10 mg total) by mouth at bedtime. 30 capsule 3  . NOVOLOG 100  UNIT/ML injection SMARTSIG:0-80 Unit(s) SUB-Q Daily    . omeprazole (PRILOSEC) 20 MG capsule Take 20 mg by mouth daily.    . Prenatal Vit-Fe Fumarate-FA (PRENATAL VITAMIN PO) Take by mouth.    . propranolol (INDERAL) 20 MG tablet Take 1 tablet (20 mg total) by mouth 2 (two) times daily. 60 tablet 6  . sertraline (ZOLOFT) 25 MG tablet Take 12.5 mg by mouth daily.    Marland Kitchen ibuprofen (ADVIL) 800 MG tablet Take 1 tablet (800 mg total) by mouth every 8 (eight) hours as needed for moderate pain. (Patient not taking: Reported on 02/20/2021) 21 tablet 0   No current facility-administered medications for this visit.    Allergies as of 02/20/2021 - Review Complete 02/20/2021  Allergen Reaction Noted  . Penicillins Anaphylaxis, Itching, and Swelling 02/18/2012  . Cephalexin Swelling and Other (See Comments) 05/24/2019  .  Labetalol Palpitations 02/20/2021    Vitals: BP 129/86 (BP Location: Left Arm, Patient Position: Sitting)   Pulse 77   Ht 5\' 6"  (1.676 m)   Wt 210 lb (95.3 kg)   BMI 33.89 kg/m  Last Weight:  Wt Readings from Last 1 Encounters:  02/20/21 210 lb (95.3 kg)   Last Height:   Ht Readings from Last 1 Encounters:  02/20/21 5\' 6"  (1.676 m)     Physical exam: Exam: Gen: NAD, conversant, well nourised, 8 months pregnant, well groomed                     CV: RRR, no MRG. No Carotid Bruits. No peripheral edema, warm, nontender Eyes: Conjunctivae clear without exudates or hemorrhage  Neuro: Detailed Neurologic Exam  Speech:    Speech is normal; fluent and spontaneous with normal comprehension.  Cognition:    The patient is oriented to person, place, and time;     recent and remote memory intact;     language fluent;     normal attention, concentration,     fund of knowledge Cranial Nerves:    The pupils are equal, round, and reactive to light. The fundi are normal and spontaneous venous pulsations are present. Visual fields are full to finger confrontation. Extraocular  movements are intact. Trigeminal sensation is intact and the muscles of mastication are normal. The face is symmetric. The palate elevates in the midline. Hearing intact. Voice is normal. Shoulder shrug is normal. The tongue has normal motion without fasciculations.   Coordination:    No dysmetria or ataxia  Gait:    Appropriate gait for 8 months pregnant  Motor Observation:    No asymmetry, no atrophy, and no involuntary movements noted. Tone:    Normal muscle tone.    Posture:    Posture is normal. normal erect    Strength:    Strength is V/V in the upper and lower limbs.      Sensation: intact to LT     Reflex Exam:  DTR's:    Deep tendon reflexes in the upper and lower extremities are normal bilaterally.   Toes:    The toes are downgoing bilaterally.   Clonus:    Clonus is absent.    Assessment/Plan:  Lovely 30 year old patient her with migraines. She has one moderate to severe migraine a month but also have 3 headaches.mild migraines a week. She is 8 months pregnant. She is on propranolol 10mg  a day.MRI/MRV was negative.  Increase propranolol to 20mg  twice daily every day - preventative Start nortriptyline low dose at bedtime for prevention (may also help with sleep) Acute: Try 10mg  propranolol or reglan prn (watch for side effects of propranolol, take no more than 2x daily additionally. May also try these together with tylenol.   Discussed: To prevent or relieve headaches, try the following: Cool Compress. Lie down and place a cool compress on your head.  Avoid headache triggers. If certain foods or odors seem to have triggered your migraines in the past, avoid them. A headache diary might help you identify triggers.  Include physical activity in your daily routine. Try a daily walk or other moderate aerobic exercise.  Manage stress. Find healthy ways to cope with the stressors, such as delegating tasks on your to-do list.  Practice relaxation techniques. Try deep  breathing, yoga, massage and visualization.  Eat regularly. Eating regularly scheduled meals and maintaining a healthy diet might help prevent headaches. Also, drink plenty  of fluids.  Follow a regular sleep schedule. Sleep deprivation might contribute to headaches Consider biofeedback. With this mind-body technique, you learn to control certain bodily functions -- such as muscle tension, heart rate and blood pressure -- to prevent headaches or reduce headache pain.    Proceed to emergency room if you experience new or worsening symptoms or symptoms do not resolve, if you have new neurologic symptoms or if headache is severe, or for any concerning symptom.   Provided education and documentation from [employer] toolbox including articles on: chronic migraine medication overuse headache, chronic migraines, prevention of migraines, behavioral and other nonpharmacologic treatments for headache.   No orders of the defined types were placed in this encounter.  Meds ordered this encounter  Medications  . propranolol (INDERAL) 20 MG tablet    Sig: Take 1 tablet (20 mg total) by mouth 2 (two) times daily.    Dispense:  60 tablet    Refill:  6  . metoCLOPramide (REGLAN) 10 MG tablet    Sig: Take 1 tablet (10 mg total) by mouth 4 (four) times daily as needed for nausea.    Dispense:  60 tablet    Refill:  2  . nortriptyline (PAMELOR) 10 MG capsule    Sig: Take 1 capsule (10 mg total) by mouth at bedtime.    Dispense:  30 capsule    Refill:  3    Cc: Terrilee Files, MD,  Pcp, No  Naomie Dean, MD  [facility] [address] [place], [place] [address]  Phone [phone] Fax [phone]

## 2021-02-20 NOTE — Patient Instructions (Addendum)
Increase propranolol to 8m twice daily every day - preventative Start nortriptyline low dose at bedtime for prevention (may also help with sleep) Acute: Try 172mpropranolol or reglan. May also try these together with tylenol.   Metoclopramide tablets What is this medicine? METOCLOPRAMIDE (met oh kloe PRA mide) is used to treat the symptoms of gastroesophageal reflux disease (GERD) like heartburn. It is also used to treat people with slow emptying of the stomach and intestinal tract. Also with migraines.  This medicine may be used for other purposes; ask your health care provider or pharmacist if you have questions. COMMON BRAND NAME(S): Reglan What should I tell my health care provider before I take this medicine? They need to know if you have any of these conditions:  breast cancer  depression  diabetes  heart failure  high blood pressure  if you often drink alcohol  kidney disease  liver disease  Parkinson's disease or a movement disorder  pheochromocytoma  seizures  stomach obstruction, bleeding, or perforation  an unusual or allergic reaction to metoclopramide, procainamide, other medicines, foods, dyes, or preservatives  pregnant or trying to get pregnant  breast-feeding How should I use this medicine? Take this medicine by mouth with a glass of water. Follow the directions on the prescription label. Take this medicine on an empty stomach, about 30 minutes before eating. Take your doses at regular intervals. Do not take your medicine more often than directed. Do not stop taking except on the advice of your doctor or health care professional. A special MedGuide will be given to you by the pharmacist with each prescription and refill. Be sure to read this information carefully each time. Talk to your pediatrician regarding the use of this medicine in children. Special care may be needed. Overdosage: If you think you have taken too much of this medicine contact a  poison control center or emergency room at once. NOTE: This medicine is only for you. Do not share this medicine with others. What if I miss a dose? If you miss a dose, skip it. Take your next dose at the normal time. Do not take extra or 2 doses at the same time to make up for the missed dose. What may interact with this medicine?  alcohol  antihistamines for allergy, cough, and cold  atovaquone  atropine  bupropion  certain medicines for anxiety or sleep  certain medicines for bladder problems like oxybutynin, tolterodine  certain medicines for depression or psychotic disorders  certain medicines for Parkinson's disease  certain medicines for seizures like phenobarbital, primidone  certain medicines for stomach problems like dicyclomine, hyoscyamine  certain medicines for travel sickness like scopolamine  cyclosporine  digoxin  fosfomycin  general anesthetics like halothane, isoflurane, methoxyflurane, propofol  insulin and other medicines for diabetes  ipratropium  MAOIs like Carbex, Eldepryl, Marplan, Nardil, and Parnate  medicines that relax muscles for surgery  narcotic medicines for pain  paroxetine  phenothiazines like chlorpromazine, mesoridazine, prochlorperazine, thioridazine  posaconazole  quinidine  sirolimus  tacrolimus This list may not describe all possible interactions. Give your health care provider a list of all the medicines, herbs, non-prescription drugs, or dietary supplements you use. Also tell them if you smoke, drink alcohol, or use illegal drugs. Some items may interact with your medicine. What should I watch for while using this medicine? It may take a few weeks for your stomach condition to start to get better. However, do not take this medicine for longer than 12 weeks. The longer  you take this medicine, and the more you take it, the greater your chances are of developing serious side effects. If you are an elderly patient, a  female patient, or you have diabetes, you may be at an increased risk for side effects from this medicine. Contact your doctor immediately if you start having movements you cannot control such as lip smacking, rapid movements of the tongue, involuntary or uncontrollable movements of the eyes, head, arms and legs, or muscle twitches and spasms. Patients and their families should watch out for worsening depression or thoughts of suicide. Also watch out for any sudden or severe changes in feelings such as feeling anxious, agitated, panicky, irritable, hostile, aggressive, impulsive, severely restless, overly excited and hyperactive, or not being able to sleep. If this happens, especially at the beginning of treatment or after a change in dose, call your doctor. Do not treat yourself for high fever. Ask your doctor or health care professional for advice. You may get drowsy or dizzy. Do not drive, use machinery, or do anything that needs mental alertness until you know how this drug affects you. Do not stand or sit up quickly, especially if you are an older patient. This reduces the risk of dizzy or fainting spells. Alcohol can make you more drowsy and dizzy. Avoid alcoholic drinks. What side effects may I notice from receiving this medicine? Side effects that you should report to your doctor or health care professional as soon as possible:  allergic reactions like skin rash, itching or hives, swelling of the face, lips, or tongue  abnormal production of milk  breast enlargement in both males and females  change in sex drive or performance  depressed mood  menstrual changes  restlessness, pacing, inability to keep still  signs and symptoms of neuroleptic malignant syndrome (NMS) like confusion; fast, irregular heartbeat; high fever; increased sweating; uncontrollable head, mouth, neck, arm, or leg movements; stiff muscles  seizures  suicidal thoughts, mood changes  swelling of the ankles, feet,  hands  tremor  uncontrollable movements of the face, head, mouth, neck, arms, legs, or upper body  unusually weak or tired Side effects that usually do not require medical attention (report to your doctor or health care professional if they continue or are bothersome):  dizziness  drowsiness  headache  tiredness This list may not describe all possible side effects. Call your doctor for medical advice about side effects. You may report side effects to FDA at 1-800-FDA-1088. Where should I keep my medicine? Keep out of the reach of children. Store at room temperature between 20 and 25 degrees C (68 and 77 degrees F). Protect from light. Keep container tightly closed. Throw away any unused medicine after the expiration date. NOTE: This sheet is a summary. It may not cover all possible information. If you have questions about this medicine, talk to your doctor, pharmacist, or health care provider.  2021 Elsevier/Gold Standard (2019-07-10 10:16:27) Nortriptyline capsules What is this medicine? NORTRIPTYLINE (nor TRIP ti leen) is used to treat depression. This medicine may be used for other purposes; ask your health care provider or pharmacist if you have questions. COMMON BRAND NAME(S): Aventyl, Pamelor What should I tell my health care provider before I take this medicine? They need to know if you have any of these conditions:  bipolar disorder  Brugada syndrome  difficulty passing urine  glaucoma  heart disease  if you drink alcohol  liver disease  schizophrenia  seizures  suicidal thoughts, plans or attempt; a  previous suicide attempt by you or a family member  thyroid disease  an unusual or allergic reaction to nortriptyline, other tricyclic antidepressants, other medicines, foods, dyes, or preservatives  pregnant or trying to get pregnant  breast-feeding How should I use this medicine? Take this medicine by mouth with a glass of water. Follow the directions  on the prescription label. Take your doses at regular intervals. Do not take it more often than directed. Do not stop taking this medicine suddenly except upon the advice of your doctor. Stopping this medicine too quickly may cause serious side effects or your condition may worsen. A special MedGuide will be given to you by the pharmacist with each prescription and refill. Be sure to read this information carefully each time. Talk to your pediatrician regarding the use of this medicine in children. Special care may be needed. Overdosage: If you think you have taken too much of this medicine contact a poison control center or emergency room at once. NOTE: This medicine is only for you. Do not share this medicine with others. What if I miss a dose? If you miss a dose, take it as soon as you can. If it is almost time for your next dose, take only that dose. Do not take double or extra doses. What may interact with this medicine? Do not take this medicine with any of the following medications:  cisapride  dronedarone  linezolid  MAOIs like Carbex, Eldepryl, Marplan, Nardil, and Parnate  methylene blue (injected into a vein)  pimozide  thioridazine This medicine may also interact with the following medications:  alcohol  antihistamines for allergy, cough, and cold  atropine  certain medicines for bladder problems like oxybutynin, tolterodine  certain medicines for depression like amitriptyline, fluoxetine, sertraline  certain medicines for Parkinson's disease like benztropine, trihexyphenidyl  certain medicines for stomach problems like dicyclomine, hyoscyamine  certain medicines for travel sickness like scopolamine  chlorpropamide  cimetidine  ipratropium  other medicines that prolong the QT interval (an abnormal heart rhythm) like dofetilide  other medicines that can cause serotonin syndrome like St. John's Wort, fentanyl, lithium, tramadol, tryptophan, buspirone, and  some medicines for headaches like sumatriptan or rizatriptan  quinidine  reserpine  thyroid medicine This list may not describe all possible interactions. Give your health care provider a list of all the medicines, herbs, non-prescription drugs, or dietary supplements you use. Also tell them if you smoke, drink alcohol, or use illegal drugs. Some items may interact with your medicine. What should I watch for while using this medicine? Tell your doctor if your symptoms do not get better or if they get worse. Visit your doctor or health care professional for regular checks on your progress. Because it may take several weeks to see the full effects of this medicine, it is important to continue your treatment as prescribed by your doctor. Patients and their families should watch out for new or worsening thoughts of suicide or depression. Also watch out for sudden changes in feelings such as feeling anxious, agitated, panicky, irritable, hostile, aggressive, impulsive, severely restless, overly excited and hyperactive, or not being able to sleep. If this happens, especially at the beginning of treatment or after a change in dose, call your health care professional. Dennis Bast may get drowsy or dizzy. Do not drive, use machinery, or do anything that needs mental alertness until you know how this medicine affects you. Do not stand or sit up quickly, especially if you are an older patient. This reduces the  risk of dizzy or fainting spells. Alcohol may interfere with the effect of this medicine. Avoid alcoholic drinks. Do not treat yourself for coughs, colds, or allergies without asking your doctor or health care professional for advice. Some ingredients can increase possible side effects. Your mouth may get dry. Chewing sugarless gum or sucking hard candy, and drinking plenty of water may help. Contact your doctor if the problem does not go away or is severe. This medicine may cause dry eyes and blurred vision. If  you wear contact lenses you may feel some discomfort. Lubricating drops may help. See your eye doctor if the problem does not go away or is severe. This medicine can cause constipation. Try to have a bowel movement at least every 2 to 3 days. If you do not have a bowel movement for 3 days, call your doctor or health care professional. This medicine can make you more sensitive to the sun. Keep out of the sun. If you cannot avoid being in the sun, wear protective clothing and use sunscreen. Do not use sun lamps or tanning beds/booths. What side effects may I notice from receiving this medicine? Side effects that you should report to your doctor or health care professional as soon as possible:  allergic reactions like skin rash, itching or hives, swelling of the face, lips, or tongue  anxious  breathing problems  changes in vision  confusion  elevated mood, decreased need for sleep, racing thoughts, impulsive behavior  eye pain  fast, irregular heartbeat  feeling faint or lightheaded, falls  feeling agitated, angry, or irritable  fever with increased sweating  hallucination, loss of contact with reality  seizures  stiff muscles  suicidal thoughts or other mood changes  tingling, pain, or numbness in the feet or hands  trouble passing urine or change in the amount of urine  trouble sleeping  unusually weak or tired  vomiting  yellowing of the eyes or skin Side effects that usually do not require medical attention (report to your doctor or health care professional if they continue or are bothersome):  change in sex drive or performance  change in appetite or weight  constipation  dizziness  dry mouth  nausea  tired  tremors  upset stomach This list may not describe all possible side effects. Call your doctor for medical advice about side effects. You may report side effects to FDA at 1-800-FDA-1088. Where should I keep my medicine? Keep out of the reach  of children. Store at room temperature between 15 and 30 degrees C (59 and 86 degrees F). Keep container tightly closed. Throw away any unused medicine after the expiration date. NOTE: This sheet is a summary. It may not cover all possible information. If you have questions about this medicine, talk to your doctor, pharmacist, or health care provider.  2021 Elsevier/Gold Standard (2020-07-02 15:25:34) Propranolol Tablets What is this medicine? PROPRANOLOL (proe PRAN oh lole) is a beta blocker. It decreases the amount of work your heart has to do and helps your heart beat regularly. It treats high blood pressure and/or prevent chest pain (also called angina). It is also used after a heart attack to prevent a second one. This medicine may be used for other purposes; ask your health care provider or pharmacist if you have questions. COMMON BRAND NAME(S): Inderal What should I tell my health care provider before I take this medicine? They need to know if you have any of these conditions:  circulation problems or blood vessel disease  diabetes  history of heart attack or heart disease, vasospastic angina  kidney disease  liver disease  lung or breathing disease, like asthma or emphysema  pheochromocytoma  slow heart rate  thyroid disease  an unusual or allergic reaction to propranolol, other beta-blockers, medicines, foods, dyes, or preservatives  pregnant or trying to get pregnant  breast-feeding How should I use this medicine? Take this drug by mouth. Take it as directed on the prescription label at the same time every day. Keep taking it unless your health care provider tells you to stop. Talk to your health care provider about the use of this drug in children. Special care may be needed. Overdosage: If you think you have taken too much of this medicine contact a poison control center or emergency room at once. NOTE: This medicine is only for you. Do not share this medicine with  others. What if I miss a dose? If you miss a dose, take it as soon as you can. If it is almost time for your next dose, take only that dose. Do not take double or extra doses. What may interact with this medicine? Do not take this medicine with any of the following medications:  feverfew  phenothiazines like chlorpromazine, mesoridazine, prochlorperazine, thioridazine This medicine may also interact with the following medications:  aluminum hydroxide gel  antipyrine  antiviral medicines for HIV or AIDS  barbiturates like phenobarbital  certain medicines for blood pressure, heart disease, irregular heart beat  cimetidine  ciprofloxacin  diazepam  fluconazole  haloperidol  isoniazid  medicines for cholesterol like cholestyramine or colestipol  medicines for mental depression  medicines for migraine headache like almotriptan, eletriptan, frovatriptan, naratriptan, rizatriptan, sumatriptan, zolmitriptan  NSAIDs, medicines for pain and inflammation, like ibuprofen or naproxen  phenytoin  rifampin  teniposide  theophylline  thyroid medicines  tolbutamide  warfarin  zileuton This list may not describe all possible interactions. Give your health care provider a list of all the medicines, herbs, non-prescription drugs, or dietary supplements you use. Also tell them if you smoke, drink alcohol, or use illegal drugs. Some items may interact with your medicine. What should I watch for while using this medicine? Visit your doctor or health care professional for regular check ups. Check your blood pressure and pulse rate regularly. Ask your health care professional what your blood pressure and pulse rate should be, and when you should contact them. You may get drowsy or dizzy. Do not drive, use machinery, or do anything that needs mental alertness until you know how this drug affects you. Do not stand or sit up quickly, especially if you are an older patient. This reduces  the risk of dizzy or fainting spells. Alcohol can make you more drowsy and dizzy. Avoid alcoholic drinks. This medicine may increase blood sugar. Ask your healthcare provider if changes in diet or medicines are needed if you have diabetes. Do not treat yourself for coughs, colds, or pain while you are taking this medicine without asking your doctor or health care professional for advice. Some ingredients may increase your blood pressure. What side effects may I notice from receiving this medicine? Side effects that you should report to your doctor or health care professional as soon as possible:  allergic reactions like skin rash, itching or hives, swelling of the face, lips, or tongue  breathing problems  cold hands or feet  difficulty sleeping, nightmares  dry peeling skin  hallucinations  muscle cramps or weakness  signs and symptoms of high  blood sugar such as being more thirsty or hungry or having to urinate more than normal. You may also feel very tired or have blurry vision.  slow heart rate  swelling of the legs and ankles  vomiting Side effects that usually do not require medical attention (report to your doctor or health care professional if they continue or are bothersome):  change in sex drive or performance  diarrhea  dry sore eyes  hair loss  nausea  weak or tired This list may not describe all possible side effects. Call your doctor for medical advice about side effects. You may report side effects to FDA at 1-800-FDA-1088. Where should I keep my medicine? Keep out of the reach of children and pets. Store at room temperature between 20 and 25 degrees C (68 and 77 degrees F). Protect from light. Throw away any unused drug after the expiration date. NOTE: This sheet is a summary. It may not cover all possible information. If you have questions about this medicine, talk to your doctor, pharmacist, or health care provider.  2021 Elsevier/Gold Standard  (2019-06-23 19:25:51)

## 2021-03-05 ENCOUNTER — Ambulatory Visit (INDEPENDENT_AMBULATORY_CARE_PROVIDER_SITE_OTHER): Payer: No Typology Code available for payment source | Admitting: Psychology

## 2021-03-05 DIAGNOSIS — F319 Bipolar disorder, unspecified: Secondary | ICD-10-CM

## 2021-03-06 ENCOUNTER — Ambulatory Visit: Payer: No Typology Code available for payment source | Admitting: Psychology

## 2021-03-13 ENCOUNTER — Ambulatory Visit: Payer: No Typology Code available for payment source | Admitting: Psychology

## 2021-03-27 ENCOUNTER — Ambulatory Visit (INDEPENDENT_AMBULATORY_CARE_PROVIDER_SITE_OTHER): Payer: No Typology Code available for payment source | Admitting: Psychology

## 2021-03-27 DIAGNOSIS — F319 Bipolar disorder, unspecified: Secondary | ICD-10-CM | POA: Diagnosis not present

## 2021-04-08 ENCOUNTER — Ambulatory Visit (INDEPENDENT_AMBULATORY_CARE_PROVIDER_SITE_OTHER): Payer: No Typology Code available for payment source | Admitting: Psychology

## 2021-04-08 DIAGNOSIS — F319 Bipolar disorder, unspecified: Secondary | ICD-10-CM | POA: Diagnosis not present

## 2021-04-14 ENCOUNTER — Ambulatory Visit (INDEPENDENT_AMBULATORY_CARE_PROVIDER_SITE_OTHER): Payer: No Typology Code available for payment source | Admitting: Psychology

## 2021-04-14 DIAGNOSIS — F319 Bipolar disorder, unspecified: Secondary | ICD-10-CM | POA: Diagnosis not present

## 2021-04-17 ENCOUNTER — Encounter: Payer: Self-pay | Admitting: Neurology

## 2021-04-17 ENCOUNTER — Ambulatory Visit (INDEPENDENT_AMBULATORY_CARE_PROVIDER_SITE_OTHER): Payer: No Typology Code available for payment source | Admitting: Neurology

## 2021-04-17 DIAGNOSIS — Z5329 Procedure and treatment not carried out because of patient's decision for other reasons: Secondary | ICD-10-CM

## 2021-04-17 NOTE — Progress Notes (Signed)
NO SHOW

## 2021-04-21 ENCOUNTER — Ambulatory Visit: Payer: No Typology Code available for payment source | Admitting: Psychology

## 2021-04-29 ENCOUNTER — Ambulatory Visit (INDEPENDENT_AMBULATORY_CARE_PROVIDER_SITE_OTHER): Payer: No Typology Code available for payment source | Admitting: Psychology

## 2021-04-29 DIAGNOSIS — F319 Bipolar disorder, unspecified: Secondary | ICD-10-CM

## 2021-05-20 ENCOUNTER — Ambulatory Visit (INDEPENDENT_AMBULATORY_CARE_PROVIDER_SITE_OTHER): Payer: No Typology Code available for payment source | Admitting: Psychology

## 2021-05-20 DIAGNOSIS — F319 Bipolar disorder, unspecified: Secondary | ICD-10-CM

## 2021-05-27 ENCOUNTER — Ambulatory Visit: Payer: No Typology Code available for payment source | Admitting: Psychology

## 2021-06-05 ENCOUNTER — Ambulatory Visit (INDEPENDENT_AMBULATORY_CARE_PROVIDER_SITE_OTHER): Payer: Self-pay | Admitting: Psychology

## 2021-06-05 DIAGNOSIS — F319 Bipolar disorder, unspecified: Secondary | ICD-10-CM | POA: Diagnosis not present

## 2021-06-16 ENCOUNTER — Ambulatory Visit (INDEPENDENT_AMBULATORY_CARE_PROVIDER_SITE_OTHER): Payer: No Typology Code available for payment source | Admitting: Psychology

## 2021-06-16 DIAGNOSIS — F319 Bipolar disorder, unspecified: Secondary | ICD-10-CM | POA: Diagnosis not present

## 2021-07-03 ENCOUNTER — Ambulatory Visit (INDEPENDENT_AMBULATORY_CARE_PROVIDER_SITE_OTHER): Payer: Self-pay | Admitting: Psychology

## 2021-07-03 DIAGNOSIS — F319 Bipolar disorder, unspecified: Secondary | ICD-10-CM | POA: Diagnosis not present

## 2021-07-17 ENCOUNTER — Ambulatory Visit (INDEPENDENT_AMBULATORY_CARE_PROVIDER_SITE_OTHER): Payer: No Typology Code available for payment source | Admitting: Psychology

## 2021-07-17 DIAGNOSIS — F319 Bipolar disorder, unspecified: Secondary | ICD-10-CM

## 2021-07-24 ENCOUNTER — Ambulatory Visit: Payer: No Typology Code available for payment source | Admitting: Psychology

## 2021-08-07 ENCOUNTER — Ambulatory Visit (INDEPENDENT_AMBULATORY_CARE_PROVIDER_SITE_OTHER): Payer: No Typology Code available for payment source | Admitting: Psychology

## 2021-08-07 DIAGNOSIS — F319 Bipolar disorder, unspecified: Secondary | ICD-10-CM

## 2021-08-21 ENCOUNTER — Ambulatory Visit (INDEPENDENT_AMBULATORY_CARE_PROVIDER_SITE_OTHER): Payer: No Typology Code available for payment source | Admitting: Psychology

## 2021-08-21 DIAGNOSIS — F319 Bipolar disorder, unspecified: Secondary | ICD-10-CM | POA: Diagnosis not present

## 2021-09-04 ENCOUNTER — Ambulatory Visit: Payer: No Typology Code available for payment source | Admitting: Psychology

## 2021-09-18 ENCOUNTER — Ambulatory Visit: Payer: No Typology Code available for payment source | Admitting: Psychology

## 2021-10-28 ENCOUNTER — Other Ambulatory Visit: Payer: Self-pay | Admitting: Neurology

## 2021-11-24 ENCOUNTER — Other Ambulatory Visit: Payer: Self-pay

## 2021-11-24 ENCOUNTER — Ambulatory Visit (HOSPITAL_COMMUNITY)
Admission: EM | Admit: 2021-11-24 | Discharge: 2021-11-24 | Disposition: A | Discharge status: Home / Self Care | Payer: PRIVATE HEALTH INSURANCE | Attending: Physician Assistant | Admitting: Physician Assistant

## 2021-11-24 ENCOUNTER — Emergency Department (HOSPITAL_COMMUNITY): Payer: No Typology Code available for payment source

## 2021-11-24 ENCOUNTER — Emergency Department (HOSPITAL_COMMUNITY)
Admission: EM | Admit: 2021-11-24 | Discharge: 2021-11-25 | Disposition: A | Discharge status: Home / Self Care | Payer: No Typology Code available for payment source | Attending: Emergency Medicine | Admitting: Emergency Medicine

## 2021-11-24 ENCOUNTER — Encounter (HOSPITAL_COMMUNITY): Payer: Self-pay | Admitting: Emergency Medicine

## 2021-11-24 DIAGNOSIS — R109 Unspecified abdominal pain: Secondary | ICD-10-CM | POA: Insufficient documentation

## 2021-11-24 DIAGNOSIS — Z7951 Long term (current) use of inhaled steroids: Secondary | ICD-10-CM | POA: Insufficient documentation

## 2021-11-24 DIAGNOSIS — Z7982 Long term (current) use of aspirin: Secondary | ICD-10-CM | POA: Insufficient documentation

## 2021-11-24 DIAGNOSIS — R112 Nausea with vomiting, unspecified: Secondary | ICD-10-CM

## 2021-11-24 DIAGNOSIS — E109 Type 1 diabetes mellitus without complications: Secondary | ICD-10-CM | POA: Insufficient documentation

## 2021-11-24 DIAGNOSIS — Z794 Long term (current) use of insulin: Secondary | ICD-10-CM | POA: Insufficient documentation

## 2021-11-24 DIAGNOSIS — Z7984 Long term (current) use of oral hypoglycemic drugs: Secondary | ICD-10-CM | POA: Insufficient documentation

## 2021-11-24 DIAGNOSIS — Z87891 Personal history of nicotine dependence: Secondary | ICD-10-CM | POA: Insufficient documentation

## 2021-11-24 DIAGNOSIS — Z87448 Personal history of other diseases of urinary system: Secondary | ICD-10-CM

## 2021-11-24 DIAGNOSIS — E108 Type 1 diabetes mellitus with unspecified complications: Secondary | ICD-10-CM

## 2021-11-24 DIAGNOSIS — R197 Diarrhea, unspecified: Secondary | ICD-10-CM

## 2021-11-24 DIAGNOSIS — R350 Frequency of micturition: Secondary | ICD-10-CM

## 2021-11-24 DIAGNOSIS — E1065 Type 1 diabetes mellitus with hyperglycemia: Secondary | ICD-10-CM | POA: Insufficient documentation

## 2021-11-24 DIAGNOSIS — E860 Dehydration: Secondary | ICD-10-CM | POA: Insufficient documentation

## 2021-11-24 DIAGNOSIS — N390 Urinary tract infection, site not specified: Secondary | ICD-10-CM | POA: Insufficient documentation

## 2021-11-24 DIAGNOSIS — R739 Hyperglycemia, unspecified: Secondary | ICD-10-CM

## 2021-11-24 DIAGNOSIS — J45909 Unspecified asthma, uncomplicated: Secondary | ICD-10-CM | POA: Insufficient documentation

## 2021-11-24 DIAGNOSIS — Z20822 Contact with and (suspected) exposure to covid-19: Secondary | ICD-10-CM | POA: Insufficient documentation

## 2021-11-24 LAB — POCT URINALYSIS DIPSTICK, ED / UC
Glucose, UA: 500 mg/dL — AB
Leukocytes,Ua: NEGATIVE
Nitrite: NEGATIVE
Protein, ur: 30 mg/dL — AB
Specific Gravity, Urine: >=1.03 (ref 1.005–1.030)
Urobilinogen, UA: 0.2 mg/dL (ref 0.0–1.0)
pH: 5.5 (ref 5.0–8.0)

## 2021-11-24 LAB — CBC WITH DIFFERENTIAL/PLATELET
Abs Immature Granulocytes: 0.03 10*3/uL (ref 0.00–0.07)
Basophils Absolute: 0 10*3/uL (ref 0.0–0.1)
Basophils Relative: 0 %
Eosinophils Absolute: 0 10*3/uL (ref 0.0–0.5)
Eosinophils Relative: 0 %
HCT: 42.2 % (ref 36.0–46.0)
Hemoglobin: 14.1 g/dL (ref 12.0–15.0)
Immature Granulocytes: 0 %
Lymphocytes Relative: 4 %
Lymphs Abs: 0.3 10*3/uL — ABNORMAL LOW (ref 0.7–4.0)
MCH: 29.6 pg (ref 26.0–34.0)
MCHC: 33.4 g/dL (ref 30.0–36.0)
MCV: 88.5 fL (ref 80.0–100.0)
Monocytes Absolute: 0.2 10*3/uL (ref 0.1–1.0)
Monocytes Relative: 2 %
Neutro Abs: 6.8 10*3/uL (ref 1.7–7.7)
Neutrophils Relative %: 94 %
Platelets: 206 10*3/uL (ref 150–400)
RBC: 4.77 MIL/uL (ref 3.87–5.11)
RDW: 12.3 % (ref 11.5–15.5)
WBC: 7.3 10*3/uL (ref 4.0–10.5)
nRBC: 0 % (ref 0.0–0.2)

## 2021-11-24 LAB — COMPREHENSIVE METABOLIC PANEL
ALT: 20 U/L (ref 0–44)
AST: 24 U/L (ref 15–41)
Albumin: 3.7 g/dL (ref 3.5–5.0)
Alkaline Phosphatase: 83 U/L (ref 38–126)
Anion gap: 6 (ref 5–15)
BUN: 9 mg/dL (ref 6–20)
CO2: 21 mmol/L — ABNORMAL LOW (ref 22–32)
Calcium: 8.7 mg/dL — ABNORMAL LOW (ref 8.9–10.3)
Chloride: 107 mmol/L (ref 98–111)
Creatinine, Ser: 0.7 mg/dL (ref 0.44–1.00)
GFR, Estimated: >60 mL/min (ref >60)
Glucose, Bld: 220 mg/dL — ABNORMAL HIGH (ref 70–99)
Potassium: 3.8 mmol/L (ref 3.5–5.1)
Sodium: 134 mmol/L — ABNORMAL LOW (ref 135–145)
Total Bilirubin: 0.4 mg/dL (ref 0.3–1.2)
Total Protein: 6.9 g/dL (ref 6.5–8.1)

## 2021-11-24 LAB — LIPASE, BLOOD: Lipase: 27 U/L (ref 11–51)

## 2021-11-24 LAB — POC URINE PREG, ED: Preg Test, Ur: NEGATIVE

## 2021-11-24 MED ORDER — ONDANSETRON 4 MG PO TBDP
4.0000 mg | ORAL_TABLET | Freq: Once | ORAL | Status: AC
  Administered 2021-11-25: 09:00:00 4 mg via ORAL
  Filled 2021-11-24: qty 1

## 2021-11-24 MED ORDER — ONDANSETRON 4 MG PO TBDP
4.0000 mg | ORAL_TABLET | Freq: Once | ORAL | Status: AC
  Administered 2021-11-24: 19:00:00 4 mg via ORAL

## 2021-11-24 MED ORDER — ONDANSETRON 4 MG PO TBDP
ORAL_TABLET | ORAL | Status: AC
  Filled 2021-11-24: qty 1

## 2021-11-24 NOTE — ED Notes (Signed)
Patient is being discharged from the Urgent Care and sent to the Emergency Department via POV . Per ERIN RASPET PA, patient is in need of higher level of care due to Nausea, Vomiting and failed oral challenge. Patient is aware and verbalizes understanding of plan of care.  Vitals:   11/24/21 1849  BP: 101/69  Pulse: 70  Resp: 17  Temp: 98.9 F (37.2 C)  SpO2: 98%

## 2021-11-24 NOTE — ED Provider Notes (Signed)
Emergency Medicine Provider Triage Evaluation Note  Margaret Conner , a 30 y.o. female  was evaluated in triage.  Pt complains of nausea, vomiting, diarrhea since yesterday. Feels fatigued, weak. Hx DM1, c-section 8 months ago, hx pyelonephritis during pregnancy, no hx nephrolithiasis. Failed PO challenge at UC. No evidence bacterial infection or pregnancy at [facility].  Review of Systems  Positive: As above Negative: As above  Physical Exam  BP 113/70 (BP Location: Left Arm)    Pulse 67    Temp 98.6 F (37 C)    Resp 16    Ht 5\' 6"  (1.676 m)    Wt 78.5 kg    LMP 11/03/2021    SpO2 96%    BMI 27.92 kg/m  Gen:   Awake, no distress   Resp:  Normal effort  MSK:   Moves extremities without difficulty  Other:  Generalized TTP abdomen, CVA tenderness left greater than right  Medical Decision Making  Medically screening exam initiated at 9:59 PM.  Appropriate orders placed.  Margaret Conner was informed that the remainder of the evaluation will be completed by another provider, this initial triage assessment does not replace that evaluation, and the importance of remaining in the ED until their evaluation is complete.  Intractable nausea, dehydration, workup initiated -- will not repeat UA, [facility]   14/03/2021, PA-C 11/24/21 2201    2202, MD 11/24/21 2318

## 2021-11-24 NOTE — ED Provider Notes (Signed)
[place]    CSN: [id number] Arrival date & time: 11/24/21  1809      History   Chief Complaint Chief Complaint  Patient presents with   Headache   Nausea   Back Pain    HPI Margaret Conner is a 30 y.o. female.   Patient presents today with a 2-day history of fatigue and malaise.  Reports she developed a headache as well as 24 hours of diarrhea and has had some urinary frequency.  She denies any dysuria, abdominal pain, hematuria.  She did have an episode of emesis while in our waiting room.  She has a history of recurrent pyelonephritis with last episode approximately a year ago and states current symptoms are similar to previous episodes of this condition.  Denies any recent antibiotic use.  She denies any cough or congestion.  She is type I diabetic and reports her blood sugars have been adequately controlled ranging from 70-135.  She does report difficulty eating and drinking as result of nausea and vomiting symptoms.  She does report some shortness of breath.  She does have a history of DKA but reports that her diabetes has been very well controlled recently.  She denies any known sick contacts.  Denies any history of nephrolithiasis, recent urogenital procedure, self-catheterization.   Past Medical History:  Diagnosis Date   Asthma    Diabetes mellitus    Migraine     Patient Active Problem List   Diagnosis Date Noted   Migraine without aura and without status migrainosus, not intractable 02/20/2021   Pyelonephritis 05/24/2019   Diabetes mellitus 02/01/2019   Type 1 diabetes ([place]) 02/01/2019    Past Surgical History:  Procedure Laterality Date   NO PAST SURGERIES      OB History     Gravida  1   Para      Term      Preterm      AB      Living         SAB      IAB      Ectopic      Multiple      Live Births               Home Medications    Prior to Admission medications   Medication Sig Start Date End Date Taking?  Authorizing Provider  aspirin EC 81 MG tablet Take 81 mg by mouth daily. Swallow whole.    [provider]  cyclobenzaprine (FLEXERIL) 10 MG tablet Take 10 mg by mouth every 6 (six) hours as needed. 12/18/20   [provider]  ibuprofen (ADVIL) 800 MG tablet Take 1 tablet (800 mg total) by mouth every 8 (eight) hours as needed for moderate pain. Patient not taking: Reported on 02/20/2021 04/03/20   Long, Wonda Olds, MD  Insulin Human (INSULIN PUMP) SOLN Inject 1 each into the skin continuous. Pump uses Novolin-R    [provider]  metoCLOPramide (REGLAN) 10 MG tablet Take 1 tablet (10 mg total) by mouth 4 (four) times daily as needed for nausea. 02/20/21   Melvenia Beam, MD  NIFEdipine (PROCARDIA XL/NIFEDICAL XL) 60 MG 24 hr tablet Take 60 mg by mouth daily.    [provider]  nortriptyline (PAMELOR) 10 MG capsule Take 1 capsule (10 mg total) by mouth at bedtime. 02/20/21   Melvenia Beam, MD  NOVOLOG 100 UNIT/ML injection SMARTSIG:0-80 Unit(s) SUB-Q Daily 02/13/21   [provider]  omeprazole (  PRILOSEC) 20 MG capsule Take 20 mg by mouth daily.    [provider]  Prenatal Vit-Fe Fumarate-FA (PRENATAL VITAMIN PO) Take by mouth.    [provider]  propranolol (INDERAL) 20 MG tablet Take 1 tablet (20 mg total) by mouth 2 (two) times daily. Must be seen for further refills. Call [phone] to schedule. 10/28/21   Melvenia Beam, MD  sertraline (ZOLOFT) 25 MG tablet Take 12.5 mg by mouth daily.    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 Negative for appetite change.  HENT:  Negative for congestion, sinus pressure, sneezing and sore throat.   Respiratory:  Negative for cough and shortness of breath.   Cardiovascular:  Negative for chest pain.  Gastrointestinal:  Positive for diarrhea, nausea and vomiting. Negative for abdominal pain.  Genitourinary:  Positive for frequency and urgency. Negative for dysuria, flank pain, pelvic pain, vaginal bleeding, vaginal discharge and vaginal pain.  Musculoskeletal:  Positive for arthralgias, back pain and myalgias.  Neurological:  Negative for dizziness, light-headedness and headaches.    Physical Exam Triage Vital Signs ED Triage Vitals [11/24/21 1849]  Enc Vitals Group     BP 101/69     Pulse Rate 70     Resp 17     Temp 98.9 F (37.2 C)     Temp Source Oral     SpO2 98 %     Weight      Height      Head Circumference      Peak Flow      Pain Score      Pain Loc      Pain Edu?      Excl. in [place]?    No data found.  Updated Vital Signs BP 101/69 (BP Location: Left Arm)    Pulse 70    Temp 98.9 F (37.2 C) (Oral)    Resp 17    LMP 11/03/2021    SpO2 98%   Visual Acuity Right Eye Distance:   Left Eye Distance:   Bilateral Distance:    Right Eye Near:   Left Eye Near:    Bilateral Near:     Physical Exam Vitals reviewed.  Constitutional:      General: She is awake. She is not in acute distress.    Appearance: Normal appearance. She is well-developed. She is not ill-appearing.     Comments: Very pleasant female appears stated age in no acute distress  HENT:     Head: Normocephalic and atraumatic.     Mouth/Throat:     Mouth: Mucous membranes are moist.  Pharynx: Uvula midline. No oropharyngeal exudate or posterior oropharyngeal erythema.  Cardiovascular:     Rate and Rhythm: Normal rate and regular rhythm.     Heart sounds: Normal heart sounds, S1 normal and S2 normal. No murmur heard. Pulmonary:     Effort: Pulmonary effort is normal.     Breath sounds: Normal breath sounds. No wheezing, rhonchi or rales.     Comments: Clear to auscultation bilaterally Abdominal:     General: Bowel sounds are normal.     Palpations: Abdomen is soft.     Tenderness: There is no abdominal tenderness. There is right CVA tenderness and left CVA tenderness. There is no guarding or rebound.     Comments: CVA tenderness bilaterally.  No evidence of acute abdomen on physical exam.  Psychiatric:        Behavior: Behavior is cooperative.     UC Treatments / Results  Labs (all labs ordered are listed, but only abnormal results are displayed) Labs Reviewed  POCT URINALYSIS DIPSTICK, ED / UC - Abnormal; Notable for the following components:      Result Value   Glucose, UA 500 (*)    Bilirubin Urine SMALL (*)    Ketones, ur TRACE (*)    Hgb urine dipstick TRACE (*)    Protein, ur 30 (*)    All other components within normal limits  URINE CULTURE  POC URINE PREG, ED    EKG   Radiology No results found.  Procedures Procedures (including critical care time)  Medications Ordered in UC Medications  ondansetron (ZOFRAN-ODT) disintegrating tablet 4 mg (4 mg Oral Given 11/24/21 1905)    Initial Impression / Assessment and Plan / UC Course  I have reviewed the triage vital signs and the nursing notes.  Pertinent labs & imaging results that were available during my care of the patient were reviewed by me and considered in my medical decision making (see chart for details).     Patient has bilateral CVA tenderness in the setting of history of pyelonephritis.  Her blood pressure is soft but she is not hypotensive and she is currently afebrile.  UA did  show trace ketones but no significant evidence of infection.  Patient was given Zofran in clinic but failed oral challenge.  We discussed that since she is unable to keep anything down the safest thing to do would be to go to the emergency room and patient is agreeable to this.  Her vital signs were stable at the time of discharge and she will go directly to [facility] emergency room for further evaluation and management.  Final Clinical Impressions(s) / UC Diagnoses   Final diagnoses:  Intractable nausea and vomiting  Urinary frequency  Type 1 diabetes mellitus without complication (HCC)  History of pyelonephritis     Discharge Instructions      Go to ER for further evaluation and management.     ED Prescriptions   None    PDMP not reviewed this encounter.   Jeani Hawking, PA-C 11/24/21 1932

## 2021-11-24 NOTE — ED Triage Notes (Signed)
Pt c/o Urinary frequency, Headache, nausea, diarrhea that started yesterday. Vomited since been here in lobby. Home covid test negative.

## 2021-11-24 NOTE — Discharge Instructions (Signed)
Go to ER for further evaluation and management.

## 2021-11-24 NOTE — ED Triage Notes (Signed)
Pt c/o nausea, vomiting, and diarrhea. Pt went to urgent care and they sent her here because she failed her oral challenge and had ketones in her urine.

## 2021-11-25 LAB — URINALYSIS, ROUTINE W REFLEX MICROSCOPIC
Bilirubin Urine: NEGATIVE
Glucose, UA: >=500 mg/dL — AB
Ketones, ur: >80 mg/dL — AB
Leukocytes,Ua: NEGATIVE
Nitrite: POSITIVE — AB
Protein, ur: NEGATIVE mg/dL
Specific Gravity, Urine: 1.02 (ref 1.005–1.030)
pH: 5.5 (ref 5.0–8.0)

## 2021-11-25 LAB — CBG MONITORING, ED
Glucose-Capillary: 161 mg/dL — ABNORMAL HIGH (ref 70–99)
Glucose-Capillary: 242 mg/dL — ABNORMAL HIGH (ref 70–99)
Glucose-Capillary: 291 mg/dL — ABNORMAL HIGH (ref 70–99)
Glucose-Capillary: 320 mg/dL — ABNORMAL HIGH (ref 70–99)
Glucose-Capillary: 327 mg/dL — ABNORMAL HIGH (ref 70–99)

## 2021-11-25 LAB — URINALYSIS, MICROSCOPIC (REFLEX)

## 2021-11-25 LAB — RESP PANEL BY RT-PCR (FLU A&B, COVID) ARPGX2
Influenza A by PCR: NEGATIVE
Influenza B by PCR: NEGATIVE
SARS Coronavirus 2 by RT PCR: NEGATIVE

## 2021-11-25 MED ORDER — ONDANSETRON HCL 4 MG/2ML IJ SOLN
4.0000 mg | Freq: Once | INTRAMUSCULAR | Status: AC
  Administered 2021-11-25: 14:00:00 4 mg via INTRAVENOUS
  Filled 2021-11-25: qty 2

## 2021-11-25 MED ORDER — SULFAMETHOXAZOLE-TRIMETHOPRIM 800-160 MG PO TABS
1.0000 | ORAL_TABLET | Freq: Once | ORAL | Status: AC
  Administered 2021-11-25: 14:00:00 1 via ORAL
  Filled 2021-11-25: qty 1

## 2021-11-25 MED ORDER — IBUPROFEN 800 MG PO TABS
800.0000 mg | ORAL_TABLET | Freq: Once | ORAL | Status: AC
  Administered 2021-11-25: 14:00:00 800 mg via ORAL
  Filled 2021-11-25: qty 1

## 2021-11-25 MED ORDER — SULFAMETHOXAZOLE-TRIMETHOPRIM 800-160 MG PO TABS: 1.0000 | ORAL_TABLET | Freq: Two times a day (BID) | ORAL | 0 refills | Status: AC

## 2021-11-25 MED ORDER — INSULIN ASPART 100 UNIT/ML IJ SOLN
5.0000 [IU] | Freq: Once | INTRAMUSCULAR | Status: AC
  Administered 2021-11-25: 09:00:00 5 [IU] via SUBCUTANEOUS

## 2021-11-25 MED ORDER — SODIUM CHLORIDE 0.9 % IV BOLUS
1000.0000 mL | Freq: Once | INTRAVENOUS | Status: AC
  Administered 2021-11-25: 14:00:00 1000 mL via INTRAVENOUS

## 2021-11-25 MED ORDER — ONDANSETRON HCL 4 MG PO TABS: 4.0000 mg | ORAL_TABLET | Freq: Four times a day (QID) | ORAL | 0 refills | Status: AC

## 2021-11-25 MED ORDER — SODIUM CHLORIDE 0.9 % IV BOLUS
1000.0000 mL | Freq: Once | INTRAVENOUS | Status: AC
  Administered 2021-11-25: 09:00:00 1000 mL via INTRAVENOUS

## 2021-11-25 NOTE — ED Notes (Signed)
Diabetes coordinator at bedside. 

## 2021-11-25 NOTE — ED Notes (Signed)
Pt insulin pump working at this time. Pt CBG was 320. Pt gave herself 11.5 units of insulin via insulin pump

## 2021-11-25 NOTE — ED Provider Notes (Signed)
[facility] EMERGENCY DEPARTMENT Provider Note   CSN: [id number] Arrival date & time: 11/24/21  1937     History Chief Complaint  Patient presents with   Nausea   Shortness of Breath   Diarrhea    Norissa Freeney is a 30 y.o. female.  The history is provided by the patient and medical records. No language interpreter was used.  Shortness of Breath Diarrhea  30 year old female significant history of type 1 diabetes, recurrent urinary tract infection, who presents with complaints of nausea vomiting or diarrhea.  Patient report for the past 2 days she has had subjective fever, chills, backaches, felt nauseous, vomited few times, and having persistent nonbloody mucousy diarrhea.  She is unable to tolerate any p.o. and reported feeling dehydrated.  She endorsed some mild abdominal discomfort and back pain.  She denies any runny nose sneezing coughing sore throat.  She denies urinary discomfort.  She was initially seen at urgent care center, a pregnancy test and UA was obtained both show no signs of urinary tract infection or possible pregnancy.  However she failed p.o. challenge and was sent to the ER for further management.  Past Medical History:  Diagnosis Date   Asthma    Diabetes mellitus    Migraine     Patient Active Problem List   Diagnosis Date Noted   Migraine without aura and without status migrainosus, not intractable 02/20/2021   Pyelonephritis 05/24/2019   Diabetes mellitus 02/01/2019   Type 1 diabetes (HCC) 02/01/2019    Past Surgical History:  Procedure Laterality Date   NO PAST SURGERIES       OB History     Gravida  1   Para      Term      Preterm      AB      Living         SAB      IAB      Ectopic      Multiple      Live Births              Family History  Problem Relation Age of Onset   Headache Mother    High Cholesterol Other    High blood pressure Other    Cancer Other        both sides of  family    Diabetes Mellitus II Neg Hx     Social History   Tobacco Use   Smoking status: Former    Types: Cigarettes    Quit date: 2018    Years since quitting: 4.9   Smokeless tobacco: Never  Vaping Use   Vaping Use: Never used  Substance Use Topics   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 Authorizing Provider  aspirin EC 81 MG tablet Take 81 mg by mouth daily. Swallow whole.    [provider]  cyclobenzaprine (FLEXERIL) 10 MG tablet Take 10 mg by mouth every 6 (six) hours as needed. 12/18/20   [provider]  ibuprofen (ADVIL) 800 MG tablet Take 1 tablet (800 mg total) by mouth every 8 (eight) hours as needed for moderate pain. Patient not taking: Reported on 02/20/2021 04/03/20   Long, Arlyss Repress, MD  Insulin Human (INSULIN PUMP) SOLN Inject 1 each into the skin continuous. Pump uses Novolin-R  [provider]  metoCLOPramide (REGLAN) 10 MG tablet Take 1 tablet (10 mg total) by mouth 4 (four) times daily as needed for nausea. 02/20/21   Anson Fret, MD  NIFEdipine (PROCARDIA XL/NIFEDICAL XL) 60 MG 24 hr tablet Take 60 mg by mouth daily.    [provider]  nortriptyline (PAMELOR) 10 MG capsule Take 1 capsule (10 mg total) by mouth at bedtime. 02/20/21   Anson Fret, MD  NOVOLOG 100 UNIT/ML injection SMARTSIG:0-80 Unit(s) SUB-Q Daily 02/13/21   [provider]  omeprazole (PRILOSEC) 20 MG capsule Take 20 mg by mouth daily.    [provider]  Prenatal Vit-Fe Fumarate-FA (PRENATAL VITAMIN PO) Take by mouth.    [provider]  propranolol (INDERAL) 20 MG tablet Take 1 tablet (20 mg total) by mouth 2 (two) times daily. Must be seen for further refills. Call [phone] to schedule. 10/28/21   Anson Fret, MD  sertraline (ZOLOFT) 25 MG tablet Take 12.5 mg by mouth  daily.    [provider]    Allergies    Penicillins, Cephalexin, and Labetalol  Review of Systems   Review of Systems  Respiratory:  Positive for shortness of breath.   Gastrointestinal:  Positive for diarrhea.  All other systems reviewed and are negative.  Physical Exam Updated Vital Signs BP 109/64 (BP Location: Left Arm)    Pulse 75    Temp 98.7 F (37.1 C) (Oral)    Resp 20    Ht 5\' 6"  (1.676 m)    Wt 78.5 kg    LMP 11/03/2021    SpO2 96%    BMI 27.92 kg/m   Physical Exam Vitals and nursing note reviewed.  Constitutional:      General: She is not in acute distress.    Appearance: She is well-developed.  HENT:     Head: Atraumatic.  Eyes:     Conjunctiva/sclera: Conjunctivae normal.  Cardiovascular:     Rate and Rhythm: Normal rate and regular rhythm.  Pulmonary:     Effort: Pulmonary effort is normal.     Breath sounds: No decreased breath sounds.  Chest:     Chest wall: No tenderness.  Abdominal:     Palpations: Abdomen is soft.     Tenderness: There is abdominal tenderness (Mild nonfocal tenderness to palpation no guarding rebound tenderness).  Musculoskeletal:     Cervical back: Neck supple.  Skin:    Findings: No rash.  Neurological:     Mental Status: She is alert.  Psychiatric:        Mood and Affect: Mood normal.    ED Results / Procedures / Treatments   Labs (all labs ordered are listed, but only abnormal results are displayed) Labs Reviewed  CBC WITH DIFFERENTIAL/PLATELET - Abnormal; Notable for the following components:      Result Value   Lymphs Abs 0.3 (*)    All other components within normal limits  COMPREHENSIVE METABOLIC PANEL - Abnormal; Notable for the following components:   Sodium 134 (*)    CO2 21 (*)    Glucose, Bld 220 (*)    Calcium 8.7 (*)    All other components within normal limits  URINALYSIS, ROUTINE W REFLEX MICROSCOPIC - Abnormal; Notable for the following components:   Glucose, UA >=500 (*)    Hgb urine  dipstick SMALL (*)    Ketones, ur >80 (*)    Nitrite POSITIVE (*)    All other components within normal limits  URINALYSIS, MICROSCOPIC (REFLEX) - Abnormal; Notable for the following components:   Bacteria, UA FEW (*)    All other components within normal limits  CBG MONITORING, ED - Abnormal; Notable for the following components:   Glucose-Capillary 161 (*)    All other components within normal limits  CBG MONITORING, ED - Abnormal; Notable for the following components:   Glucose-Capillary 291 (*)    All other components within normal limits  CBG MONITORING, ED - Abnormal; Notable for the following components:   Glucose-Capillary 327 (*)    All other components within normal limits  CBG MONITORING, ED - Abnormal; Notable for the following components:   Glucose-Capillary 320 (*)    All other components within normal limits  CBG MONITORING, ED - Abnormal; Notable for the following components:   Glucose-Capillary 242 (*)    All other components within normal limits  RESP PANEL BY RT-PCR (FLU A&B, COVID) ARPGX2  URINE CULTURE  LIPASE, BLOOD    EKG None  Radiology CT Renal Stone Study  Result Date: 11/24/2021 CLINICAL DATA:  Left-sided flank pain EXAM: CT ABDOMEN AND PELVIS WITHOUT CONTRAST TECHNIQUE: Multidetector CT imaging of the abdomen and pelvis was performed following the standard protocol without IV contrast. COMPARISON:  04/03/2020 FINDINGS: Lower chest: No acute abnormality. Hepatobiliary: No focal liver abnormality is seen. No gallstones, gallbladder wall thickening, or biliary dilatation. Pancreas: Unremarkable. No pancreatic ductal dilatation or surrounding inflammatory changes. Spleen: Normal in size without focal abnormality. Adrenals/Urinary Tract: Adrenal glands are within normal limits. Kidneys demonstrate punctate nonobstructing stones bilaterally. No obstructive changes are seen. The ureters are within normal limits. The bladder is decompressed. Stomach/Bowel: Colon  shows no obstructive or inflammatory changes. The appendix is within normal limits. Small bowel and stomach are unremarkable. Vascular/Lymphatic: No significant vascular findings are present. No enlarged abdominal or pelvic lymph nodes. Reproductive: Uterus and bilateral adnexa are unremarkable. Other: No abdominal wall hernia or abnormality. No abdominopelvic ascites. Musculoskeletal: No acute or significant osseous findings. IMPRESSION: Tiny punctate renal stones bilaterally without obstructive change. No other focal abnormality is noted. Electronically Signed   By: Alcide Clever M.D.   On: 11/24/2021 22:55    Procedures Procedures   Medications Ordered in ED Medications  ondansetron (ZOFRAN-ODT) disintegrating tablet 4 mg (4 mg Oral Given 11/25/21 0845)  sodium chloride 0.9 % bolus 1,000 mL (0 mLs Intravenous Stopped 11/25/21 1236)  insulin aspart (novoLOG) injection 5 Units (5 Units Subcutaneous Given 11/25/21 0846)  ondansetron (ZOFRAN) injection 4 mg (4 mg Intravenous Given 11/25/21 1334)  sulfamethoxazole-trimethoprim (BACTRIM DS) 800-160 MG per tablet 1 tablet (1 tablet Oral Given 11/25/21 1336)  sodium chloride 0.9 % bolus 1,000 mL (1,000 mLs Intravenous New Bag/Given 11/25/21 1334)  ibuprofen (ADVIL) tablet 800 mg (800 mg Oral Given 11/25/21 1342)    ED Course  I have reviewed the triage vital signs and the nursing notes.  Pertinent labs & imaging results that were available during my care of the patient were reviewed by me and considered in my medical decision making (see chart for details).    MDM Rules/Calculators/A&P                         BP 120/70    Pulse 73    Temp 98.7 F (37.1 C) (Oral)    Resp 20    Ht  (1.676 m)    Wt 78.5 kg    LMP 11/03/2021    SpO2 100%  BMI 27.92 kg/m      Final Clinical Impression(s) / ED Diagnoses Final diagnoses:  Lower urinary tract infectious disease  Nausea vomiting and diarrhea  Dehydration  Hyperglycemia    Rx / DC  Orders ED Discharge Orders          Ordered    ondansetron (ZOFRAN) 4 MG tablet  Every 6 hours        11/25/21 1512    sulfamethoxazole-trimethoprim (BACTRIM DS) 800-160 MG tablet  2 times daily        11/25/21 1512           Patient here with nausea vomiting diarrhea for the past 2 days with some subjective fever and chills along with back pain.  No other COVID symptoms.  Does have history of diabetes and unable to eat and drink anything.  She was initially seen at urgent care center yesterday for complaints, UA obtained without signs of urinary tract infection, she had a negative pregnancy test.  She was unable to tolerate p.o. and was sent here for further assessment.  She was initially screened in the waiting room and CT scan of abdomen pelvis obtained showing tiny punctate renal stones bilaterally without obstructive changes and no other focal abnormal findings noted.  Currently her labs remarkable for an elevated CBG of 327, will give IV fluid and insulin.  I suspect her symptoms is likely due to her viral etiology,  1:07 PM UA obtained today shows greater than 80 ketones as well as nitrite positive.  Given this finding, will treat for UTI with Bactrim as patient is allergic to Keflex and penicillin.  CBG did improve with insulin and IV fluid.  3:14 PM At this time patient tolerates p.o.  She had received 2 L of IV fluid.  She feels better.  She will be going home with antinausea medication as well as antibiotic.  Outpatient follow-up recommended.  Diabetic coordinator has seen evaluate patient and also provide additional hardware for her insulin pump to ensure good functional insulin pump.  Appreciate her help.  Patient recommended for close follow-up.  Return precaution given.   Fayrene Helper, PA-C 11/25/21 1528    Arby Barrette, MD 11/25/21 [phone]

## 2021-11-25 NOTE — Discharge Instructions (Signed)
You have been evaluated for your symptoms.  This is likely to be a viral stomach bug that caused your nausea vomiting diarrhea.  Furthermore it appears you have a urinary tract infection.  Please take Bactrim as prescribed as treatment for UTI.  Take Zofran as needed for nausea.  Your blood sugar is elevated today, a diabetic coordinator have provide additional heart with to assist with your insulin pump.  Please monitor your blood sugar closely and follow-up closely with primary care doctor for further managements of your condition.  Return if any concern

## 2021-11-25 NOTE — ED Notes (Signed)
Reviewed discharge instructions with patient. Follow-up care and medications reviewed. Patient  verbalized understanding. Patient A&Ox4, VSS, and ambulatory with steady gait upon discharge.  °

## 2021-11-25 NOTE — Progress Notes (Signed)
Inpatient Diabetes Program Recommendations  AACE/ADA: New Consensus Statement on Inpatient Glycemic Control (2015)  Target Ranges:  Prepandial:   less than 140 mg/dL      Peak postprandial:   less than 180 mg/dL (1-2 hours)      Critically ill patients:  140 - 180 mg/dL   Lab Results  Component Value Date   GLUCAP 327 (H) 11/25/2021   HGBA1C 9.6 (A) 02/01/2019    Review of Glycemic Control  Latest Reference Range & Units 11/25/21 00:10 11/25/21 05:22 11/25/21 07:24  Glucose-Capillary 70 - 99 mg/dL 149 (H) 702 (H) 637 (H)  (H): Data is abnormally high Diabetes history: DM1 Outpatient Diabetes medications: Medtronic Insulin pump Current orders for Inpatient glycemic control: Medtronic insulin pump restarted  Inpatient Diabetes Program Recommendations:   Received consult regarding restarting insulin pump that site was out and needed to be restarted. Patient restarted new insulin insertion site from supplies from diabetes coordinator office and insulin pump is now infusing. Updated MD Arby Barrette and RN [place] Ar. Patient currently has approximately 70 units in the insulin pump reservoir and will be able to get insulin from [facility] pharmacy if pt is admitted.  Thank you, Billy Fischer. Jontrell Bushong, RN, MSN, CDE  Diabetes Coordinator Inpatient Glycemic Control Team Team Pager [phone] (8am-5pm) 11/25/2021 9:47 AM

## 2021-11-25 NOTE — ED Notes (Signed)
Pt able to keep crackers and water down at this time. Pt feels nauseated. Notified PA

## 2021-11-27 LAB — URINE CULTURE
Culture: >=100000 — AB
Culture: >=100000 — AB

## 2021-11-29 ENCOUNTER — Telehealth: Payer: Self-pay | Admitting: Emergency Medicine

## 2021-11-29 NOTE — Telephone Encounter (Signed)
Post ED Visit - Positive Culture Follow-up  Culture report reviewed by antimicrobial stewardship pharmacist: [facility] []  , Pharm.D. []  Enzo Bi, Pharm.D., BCPS AQ-ID []  , Pharm.D., BCPS []  Celedonio Miyamoto, .D., BCPS []  [place], .D., BCPS, AAHIVP []  Georgina Pillion, Pharm.D., BCPS, AAHIVP []  [address], PharmD, BCPS []  , PharmD, BCPS []  [place], PharmD, BCPS [x]  [place], PharmD []  , PharmD, BCPS []  Estella Husk, PharmD  Pharmacy Team []  [name], PharmD []  , PharmD []  Phillips Climes, PharmD []  , Rph []  Agapito Games) , PharmD []  Daylene Posey, PharmD []  , PharmD []  Mervyn Gay, PharmD []  , PharmD []  Vinnie Level, PharmD []  Wonda Olds, PharmD []  , PharmD []  Len Childs, PharmD   Positive urine culture Treated with Sulfamethoxazole-Trimethoprim, organism sensitive to the same and no further patient follow-up is required at this time.  Tulio Facundo 11/29/2021, 10:45 AM

## 2022-02-23 ENCOUNTER — Other Ambulatory Visit: Payer: Self-pay | Admitting: Neurology

## 2022-07-03 ENCOUNTER — Other Ambulatory Visit: Payer: Self-pay

## 2022-07-03 ENCOUNTER — Emergency Department (HOSPITAL_COMMUNITY)
Admission: EM | Admit: 2022-07-03 | Discharge: 2022-07-04 | Disposition: A | Discharge status: Home / Self Care | Payer: Self-pay | Attending: Emergency Medicine | Admitting: Emergency Medicine

## 2022-07-03 DIAGNOSIS — I10 Essential (primary) hypertension: Secondary | ICD-10-CM | POA: Insufficient documentation

## 2022-07-03 DIAGNOSIS — J020 Streptococcal pharyngitis: Secondary | ICD-10-CM | POA: Insufficient documentation

## 2022-07-03 DIAGNOSIS — Z7982 Long term (current) use of aspirin: Secondary | ICD-10-CM | POA: Insufficient documentation

## 2022-07-03 DIAGNOSIS — Z79899 Other long term (current) drug therapy: Secondary | ICD-10-CM | POA: Insufficient documentation

## 2022-07-03 DIAGNOSIS — E109 Type 1 diabetes mellitus without complications: Secondary | ICD-10-CM | POA: Insufficient documentation

## 2022-07-03 DIAGNOSIS — Z794 Long term (current) use of insulin: Secondary | ICD-10-CM | POA: Insufficient documentation

## 2022-07-03 DIAGNOSIS — J45909 Unspecified asthma, uncomplicated: Secondary | ICD-10-CM | POA: Insufficient documentation

## 2022-07-03 MED ORDER — DEXAMETHASONE SODIUM PHOSPHATE 10 MG/ML IJ SOLN
10.0000 mg | Freq: Once | INTRAMUSCULAR | Status: AC
  Administered 2022-07-03: 10 mg via INTRAMUSCULAR
  Filled 2022-07-03: qty 1

## 2022-07-03 NOTE — ED Provider Triage Note (Signed)
Emergency Medicine Provider Triage Evaluation Note  Margaret Conner , a 31 y.o. female  was evaluated in triage.  Pt complains of throat pain. Patient states yesterday she was diagnosed with strep throat. She was placed on clindamycin (due to allergies). States that since then she has had worsening of her pain. She also has a root canal that needs to be performed and tooth extracted. Went to dentist today who would not perform procedure with active infection. States that her symptoms are worsening with pain under her jaw on the right side, pain in the throat and difficulty swallowing. Tolerating liquids. No fevers.  Review of Systems  Positive:  Negative:   Physical Exam  BP (!) 150/101   Pulse 83   Temp 98.9 F (37.2 C) (Oral)   Resp 16   Ht 5\' 6"  (1.676 m)   Wt 73 kg   SpO2 100%   BMI 25.99 kg/m  Gen:   Awake, no distress   Resp:  Normal effort  MSK:   Moves extremities without difficulty  Other:  Barely noticeable swelling to the right face and submandibular space. Oropharynx with bilateral 1+ tonsillar swelling without evidence of PTA. No stridor.No significant sublingual swelling. No obvious periapical abscess or gingival swelling.   Medical Decision Making  Medically screening exam initiated at 9:31 PM.  Appropriate orders placed.  Sumiye Bogosian was informed that the remainder of the evaluation will be completed by another provider, this initial triage assessment does not replace that evaluation, and the importance of remaining in the ED until their evaluation is complete.     , PA-C 07/03/22 2137

## 2022-07-03 NOTE — ED Triage Notes (Signed)
Pt c/o worsening throat pain. Was seen at urgent care yesterday dx with strep throat started on antibiotic clindamycin. Was seen at emergency dentist earlier today d/t concerns worsening pain, was recommended ED visit if s/s worsen. Currently c/o worsening swelling and throat pain, jaw pain, and right lower dental pain.

## 2022-07-04 ENCOUNTER — Emergency Department (HOSPITAL_COMMUNITY): Payer: Self-pay

## 2022-07-04 ENCOUNTER — Emergency Department: Payer: Self-pay

## 2022-07-04 LAB — CBC WITH DIFFERENTIAL/PLATELET
Abs Immature Granulocytes: 0.03 10*3/uL (ref 0.00–0.07)
Basophils Absolute: 0 10*3/uL (ref 0.0–0.1)
Basophils Relative: 0 %
Eosinophils Absolute: 0 10*3/uL (ref 0.0–0.5)
Eosinophils Relative: 0 %
HCT: 41 % (ref 36.0–46.0)
Hemoglobin: 13.8 g/dL (ref 12.0–15.0)
Immature Granulocytes: 0 %
Lymphocytes Relative: 7 %
Lymphs Abs: 0.5 10*3/uL — ABNORMAL LOW (ref 0.7–4.0)
MCH: 29.1 pg (ref 26.0–34.0)
MCHC: 33.7 g/dL (ref 30.0–36.0)
MCV: 86.3 fL (ref 80.0–100.0)
Monocytes Absolute: 0.1 10*3/uL (ref 0.1–1.0)
Monocytes Relative: 1 %
Neutro Abs: 6.9 10*3/uL (ref 1.7–7.7)
Neutrophils Relative %: 92 %
Platelets: 180 10*3/uL (ref 150–400)
RBC: 4.75 MIL/uL (ref 3.87–5.11)
RDW: 12 % (ref 11.5–15.5)
WBC: 7.5 10*3/uL (ref 4.0–10.5)
nRBC: 0 % (ref 0.0–0.2)

## 2022-07-04 LAB — BASIC METABOLIC PANEL
Anion gap: 13 (ref 5–15)
BUN: 13 mg/dL (ref 6–20)
CO2: 19 mmol/L — ABNORMAL LOW (ref 22–32)
Calcium: 9 mg/dL (ref 8.9–10.3)
Chloride: 104 mmol/L (ref 98–111)
Creatinine, Ser: 0.77 mg/dL (ref 0.44–1.00)
GFR, Estimated: >60 mL/min (ref >60)
Glucose, Bld: 276 mg/dL — ABNORMAL HIGH (ref 70–99)
Potassium: 4.3 mmol/L (ref 3.5–5.1)
Sodium: 136 mmol/L (ref 135–145)

## 2022-07-04 LAB — I-STAT BETA HCG BLOOD, ED (MC, WL, AP ONLY): I-stat hCG, quantitative: <5 m[IU]/mL (ref <5)

## 2022-07-04 MED ORDER — FENTANYL CITRATE PF 50 MCG/ML IJ SOSY
50.0000 ug | PREFILLED_SYRINGE | Freq: Once | INTRAMUSCULAR | Status: AC
  Administered 2022-07-04: 50 ug via INTRAVENOUS
  Filled 2022-07-04: qty 1

## 2022-07-04 MED ORDER — IOHEXOL 300 MG/ML  SOLN
75.0000 mL | Freq: Once | INTRAMUSCULAR | Status: AC | PRN
  Administered 2022-07-04: 75 mL via INTRAVENOUS

## 2022-07-04 MED ORDER — OXYCODONE-ACETAMINOPHEN 5-325 MG PO TABS: 1.0000 | ORAL_TABLET | Freq: Four times a day (QID) | ORAL | 0 refills | Status: AC | PRN

## 2022-07-04 NOTE — Discharge Instructions (Signed)
You are seen here today for your sore throat.  You not have any abscesses in your mouth at this time.  Your physical exam and vital signs are reassuring.  Please continue previously prescribed antibiotic and use the prescribed pain medication as needed.  Follow-up in the outpatient setting with your dentist and return to the ER with any severe symptoms.

## 2022-07-04 NOTE — ED Notes (Signed)
Pt reports difficulty breathing to this RN. PA Rebekah made aware.

## 2022-07-04 NOTE — ED Notes (Signed)
Patient verbalizes understanding of d/c instructions. Opportunities for questions and answers were provided. Pt d/c from ED and ambulated to lobby.  

## 2022-07-04 NOTE — ED Provider Notes (Signed)
[facility] EMERGENCY DEPARTMENT Provider Note   CSN: [id number] Arrival date & time: 07/03/22  2104     History  Chief Complaint  Patient presents with   Sore Throat    Margaret Conner is a 31 y.o. female who presents with concern for worsening throat pain and reported facial swelling in context of known strep pharyngitis and right mandibular dental infection. Patient started on clindamycin for coverage of both infections, and directed to the ED by emergency dentist given worsening pain. Endorses throat, jaw, right dental pain. Endorses pain with swallowing, but denies difficulty swallowing. She is taking ibuprofen and tylenol alternating at home with minimal relief.  Endorsing worsening pain beneath the tongue and "at the base of my tongue".  I have personally reviewed her medical records. She has history of DMT1, asthma, and migraines. On insulin pump.   HPI     Home Medications Prior to Admission medications   Medication Sig Start Date End Date Taking? Authorizing Provider  oxyCODONE-acetaminophen (PERCOCET/ROXICET) 5-325 MG tablet Take 1 tablet by mouth every 6 (six) hours as needed for severe pain. 07/04/22  Yes Eladio Dentremont, Eugene Gavia, PA-C  aspirin EC 81 MG tablet Take 81 mg by mouth daily. Swallow whole.    [provider]  cyclobenzaprine (FLEXERIL) 10 MG tablet Take 10 mg by mouth every 6 (six) hours as needed. 12/18/20   [provider]  ibuprofen (ADVIL) 800 MG tablet Take 1 tablet (800 mg total) by mouth every 8 (eight) hours as needed for moderate pain. Patient not taking: Reported on 02/20/2021 04/03/20   Long, Arlyss Repress, MD  Insulin Human (INSULIN PUMP) SOLN Inject 1 each into the skin continuous. Pump uses Novolin-R    [provider]  metoCLOPramide (REGLAN) 10 MG tablet Take 1 tablet (10 mg total) by mouth 4 (four) times daily as needed for nausea. 02/20/21   Anson Fret, MD  NIFEdipine (PROCARDIA XL/NIFEDICAL XL) 60 MG  24 hr tablet Take 60 mg by mouth daily.    [provider]  nortriptyline (PAMELOR) 10 MG capsule Take 1 capsule (10 mg total) by mouth at bedtime. 02/20/21   Anson Fret, MD  NOVOLOG 100 UNIT/ML injection SMARTSIG:0-80 Unit(s) SUB-Q Daily 02/13/21   [provider]  omeprazole (PRILOSEC) 20 MG capsule Take 20 mg by mouth daily.    [provider]  ondansetron (ZOFRAN) 4 MG tablet Take 1 tablet (4 mg total) by mouth every 6 (six) hours. 11/25/21   Fayrene Helper, PA-C  Prenatal Vit-Fe Fumarate-FA (PRENATAL VITAMIN PO) Take by mouth.    [provider]  propranolol (INDERAL) 20 MG tablet Take 1 tablet (20 mg total) by mouth 2 (two) times daily. Must be seen for further refills. Call [phone] to schedule. 10/28/21   Anson Fret, MD  sertraline (ZOLOFT) 25 MG tablet Take 12.5 mg by mouth daily.    [provider]      Allergies    Penicillins, Cephalexin, and Labetalol    Review of Systems   Review of Systems  Constitutional: Negative.   HENT:  Positive for dental problem, facial swelling and sore throat. Negative for trouble swallowing and voice change.   Respiratory: Negative.    Cardiovascular: Negative.   Gastrointestinal: Negative.   Genitourinary: Negative.   Musculoskeletal: Negative.   Neurological: Negative.     Physical Exam Updated Vital Signs BP 132/80   Pulse 70   Temp 98.4 F (36.9 C) (Oral)   Resp 16  Ht 5\' 6"  (1.676 m)   Wt 73 kg   SpO2 98%   BMI 25.99 kg/m  Physical Exam Vitals and nursing note reviewed.  Constitutional:      General: She is in acute distress (Tearful secondary to pain).     Appearance: She is overweight. She is not ill-appearing or toxic-appearing.  HENT:     Head: Normocephalic and atraumatic.     Nose: Nose normal.     Mouth/Throat:     Mouth: Mucous membranes are moist.     Dentition: Abnormal dentition. Dental tenderness and gingival swelling present. No dental caries or dental  abscesses.     Pharynx: Oropharynx is clear. Uvula midline. Posterior oropharyngeal erythema present. No oropharyngeal exudate.     Tonsils: No tonsillar exudate or tonsillar abscesses. 1+ on the right. 1+ on the left.     Comments: No sublingual edema, but mild TTP. NO uvular deviation, gingival swelling and TTP on the right mandible, without obvious abscess.  Eyes:     General: Lids are normal. Vision grossly intact.        Right eye: No discharge.        Left eye: No discharge.     Extraocular Movements: Extraocular movements intact.     Conjunctiva/sclera: Conjunctivae normal.     Pupils: Pupils are equal, round, and reactive to light.  Neck:     Trachea: Trachea and phonation normal.     Comments: No visible facial swelling appreciated by this provider, however does have bilateral cervical LAD. Cardiovascular:     Rate and Rhythm: Normal rate and regular rhythm.     Pulses: Normal pulses.     Heart sounds: Normal heart sounds. No murmur heard. Pulmonary:     Effort: Pulmonary effort is normal. No tachypnea, bradypnea, accessory muscle usage, prolonged expiration or respiratory distress.     Breath sounds: Normal breath sounds. No wheezing or rales.  Chest:     Chest wall: No mass, lacerations, deformity, swelling, tenderness or crepitus.  Abdominal:     General: Bowel sounds are normal. There is no distension.     Palpations: Abdomen is soft.     Tenderness: There is no abdominal tenderness. There is no right CVA tenderness, left CVA tenderness, guarding or rebound.  Musculoskeletal:        General: No deformity.     Cervical back: Normal range of motion and neck supple.     Right lower leg: No edema.     Left lower leg: No edema.  Lymphadenopathy:     Cervical: Cervical adenopathy present.     Right cervical: Superficial cervical adenopathy present.     Left cervical: Superficial cervical adenopathy present.  Skin:    General: Skin is warm and dry.     Capillary Refill:  Capillary refill takes less than 2 seconds.  Neurological:     General: No focal deficit present.     Mental Status: She is alert and oriented to person, place, and time. Mental status is at baseline.  Psychiatric:        Mood and Affect: Mood normal.   31 year old   ED Results / Procedures / Treatments   Labs (all labs ordered are listed, but only abnormal results are displayed) Labs Reviewed  CBC WITH DIFFERENTIAL/PLATELET - Abnormal; Notable for the following components:      Result Value   Lymphs Abs 0.5 (*)    All other components within normal limits  BASIC METABOLIC  PANEL - Abnormal; Notable for the following components:   CO2 19 (*)    Glucose, Bld 276 (*)    All other components within normal limits  I-STAT BETA HCG BLOOD, ED ([facility], [facility], [facility] ONLY)    EKG None  Radiology CT Soft Tissue Neck W Contrast  Result Date: 07/04/2022 CLINICAL DATA:  Question Ludwig's angina. Throat pain with recent diagnosis of strep throat on antibiotics. Worsening symptoms. EXAM: CT NECK WITH CONTRAST TECHNIQUE: Multidetector CT imaging of the neck was performed using the standard protocol following the bolus administration of intravenous contrast. RADIATION DOSE REDUCTION: This exam was performed according to the departmental dose-optimization program which includes automated exposure control, adjustment of the mA and/or kV according to patient size and/or use of iterative reconstruction technique. CONTRAST:  42mL OMNIPAQUE IOHEXOL 300 MG/ML  SOLN COMPARISON:  None Available. FINDINGS: Pharynx and larynx: Mild tonsillar thickening with increased enhancement. No gas or collection. No supraglottitis or airway narrowing. Salivary glands: No inflammation, mass, or stone. Thyroid: No significant finding Lymph nodes: None enlarged or abnormal density. Vascular: Unremarkable Limited intracranial: Negative Visualized orbits: Negative Mastoids and visualized paranasal sinuses: Clear Skeleton: No acute or  aggressive finding Upper chest: Clear apical lungs IMPRESSION: No tonsillitis complication. Electronically Signed   By: Tiburcio Pea M.D.   On: 07/04/2022 04:43    Procedures Procedures    Medications Ordered in ED Medications  dexamethasone (DECADRON) injection 10 mg (10 mg Intramuscular Given 07/03/22 2157)  fentaNYL (SUBLIMAZE) injection 50 mcg (50 mcg Intravenous Given 07/04/22 0349)  iohexol (OMNIPAQUE) 300 MG/ML solution 75 mL (75 mLs Intravenous Contrast Given 07/04/22 0424)  fentaNYL (SUBLIMAZE) injection 50 mcg (50 mcg Intravenous Given 07/04/22 0454)    ED Course/ Medical Decision Making/ A&P                           Medical Decision Making 31 year old female with known strep pharyngitis and dental infection on the right mandible who presents with concern for persistent and severe pain, without improvement despite 48 hours on antibiotics with clindamycin.   Hypertensive on intake, vital signs otherwise normal.  Cardiopulmonary abdominal exams are benign.  Patient is tearful secondary to pain.  Physical exam as above without overt tonsillar edema or exudate but with posterior pharyngeal erythema.  Tenderness palpation sublingually but no sublingual or submental edema.  Gingival edema and tenderness palpation of the right mandible without periapical abscess.  Given sensation of soreness at the base of her tongue and tenderness palpation over the left tongue we will proceed with CT imaging of the neck to rule out deep space infection or edema.     Amount and/or Complexity of Data Reviewed Labs: ordered.    Details: CBC without leukocytosis or anemia, BMP unremarkable, patient is not pregnant. Radiology: ordered.    Details: CT of soft tissue of the neck visualized by this provider, overall very reassuring.  Mild tonsillar thickening and enhancement but no evidence of oropharyngeal abscess or Ludwig's angina on imaging.  I agree with radiologist interpretation.  Risk Prescription  drug management.   Patient reevaluated with improvement in her symptomatology after IV pain medication.  Will discharge with short course of oral pain meds recommend continue close outpatient follow-up.  No further work-up is warranted near this time.  Clinical concern for more emergent underlying etiology than her known dental and strep throat infections is exceedingly low. Recommend completion of previously prescribed oral antibiotics.   Dashanna  voiced understanding of her medical evaluation and treatment plan. Each of their questions answered to their expressed satisfaction.  Return precautions were given.  Patient is well-appearing, stable, and was discharged in good condition..  This chart was dictated using voice recognition software, Dragon. Despite the best efforts of this provider to proofread and correct errors, errors may still occur which can change documentation meaning. Final Clinical Impression(s) / ED Diagnoses Final diagnoses:  Strep throat    Rx / DC Orders ED Discharge Orders          Ordered    oxyCODONE-acetaminophen (PERCOCET/ROXICET) 5-325 MG tablet  Every 6 hours PRN        07/04/22 0619              Kinslie Hove, Eugene Gavia, PA-C 07/04/22 2224    Nira Conn, MD 07/06/22 [phone]

## 2022-08-10 IMAGING — CT CT RENAL STONE PROTOCOL
2 of 4 series · 17 of 46 positions shown, 19 images · non-contrast
Comparison: 04/03/2020

CLINICAL DATA: Left-sided flank pain

EXAM:
CT ABDOMEN AND PELVIS WITHOUT CONTRAST
TECHNIQUE: Multidetector CT imaging of the abdomen and pelvis was performed
following the standard protocol without IV contrast.

[Series 3: stone study 5.0 i30f 2 · axial · 0.98mm/px · z∈[+737,+1122]mm · 14 of 85 slices shown, 16 images]
[im 4/85  soft-tissue]
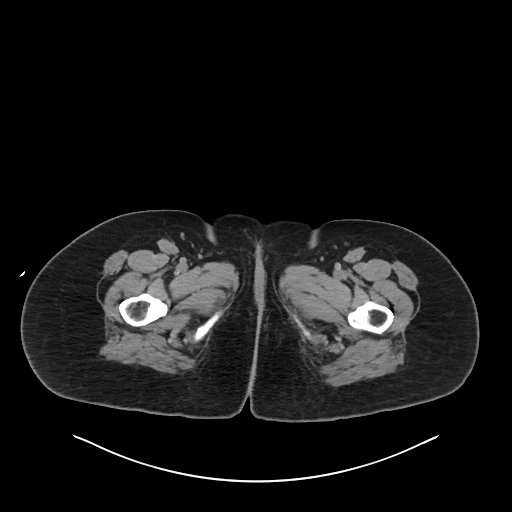
[im 4/85  bone]
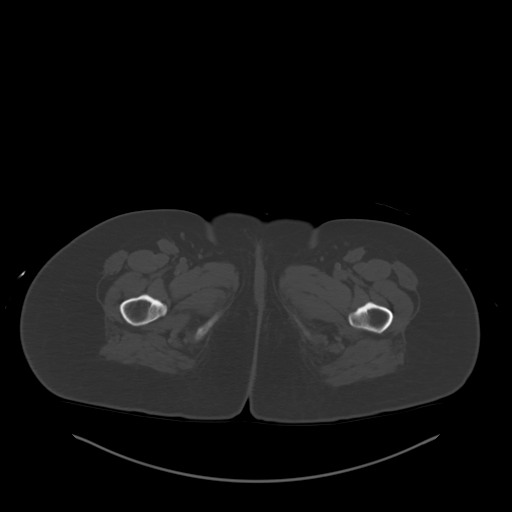
[im 11/85  soft-tissue]
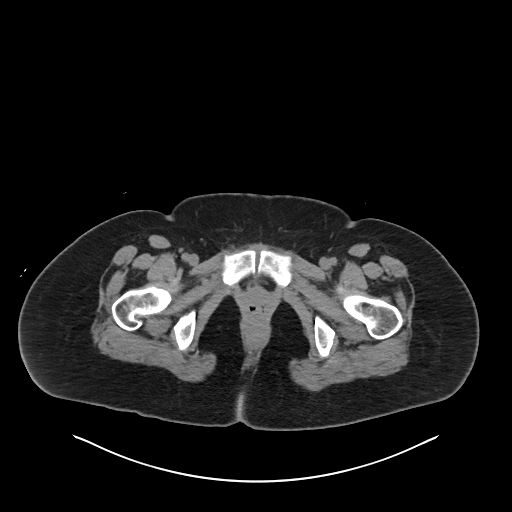
[im 17/85  soft-tissue]
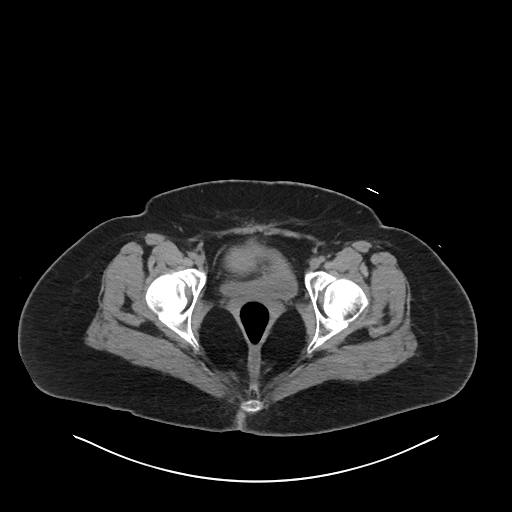
[im 24/85  soft-tissue]
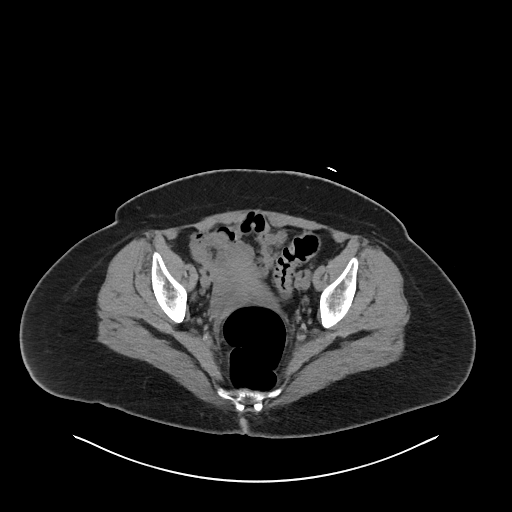
[im 27/85  soft-tissue]
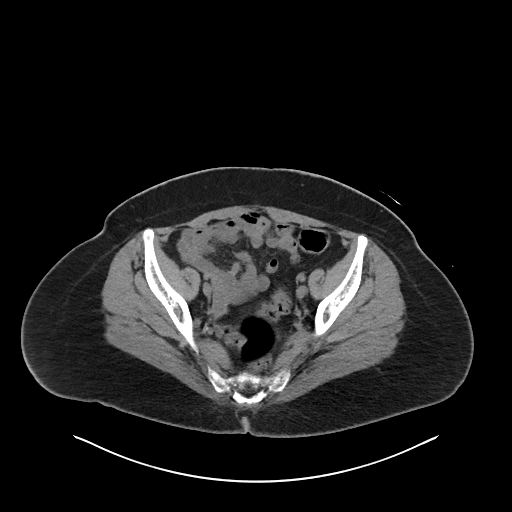
[im 34/85  soft-tissue]
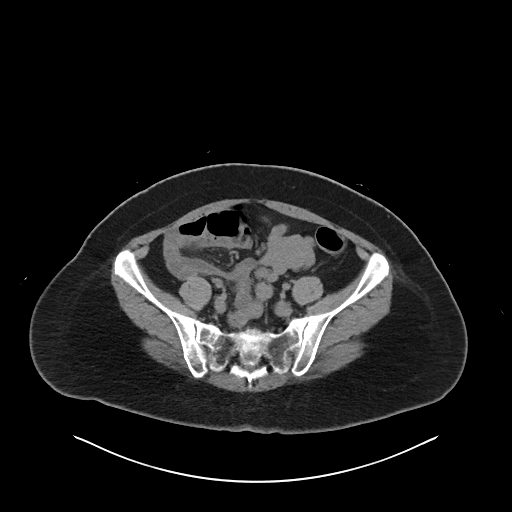
[im 41/85  soft-tissue]
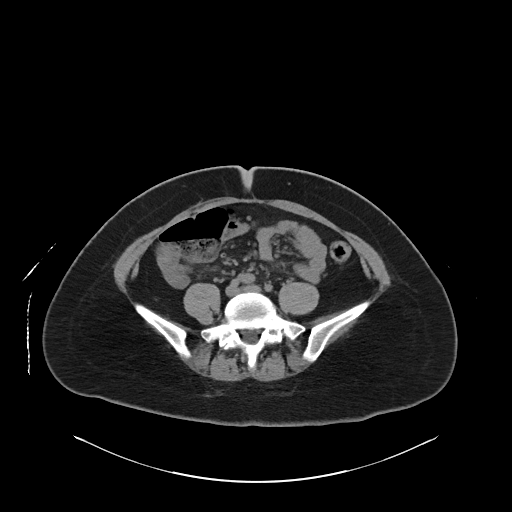
[im 44/85  soft-tissue]
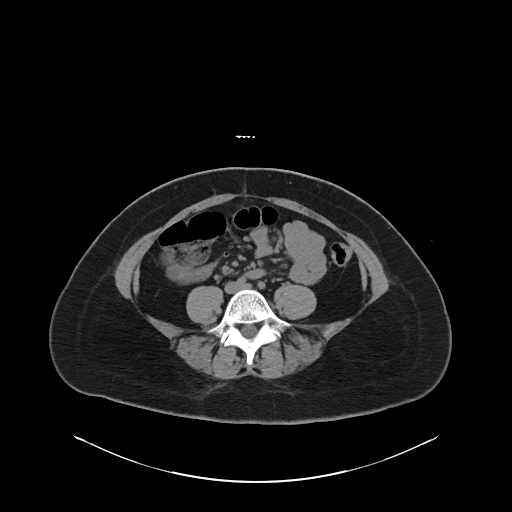
[im 51/85  soft-tissue]
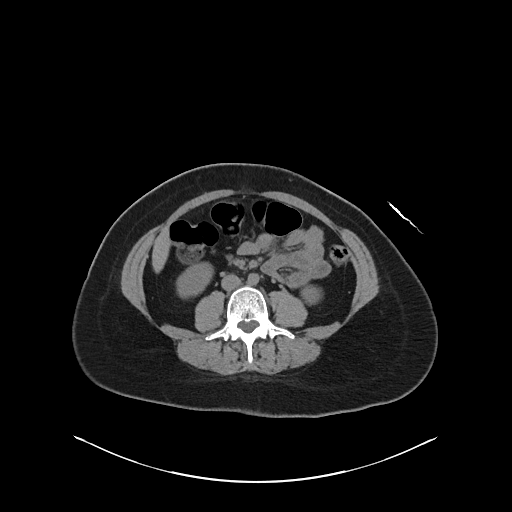
[im 51/85  bone]
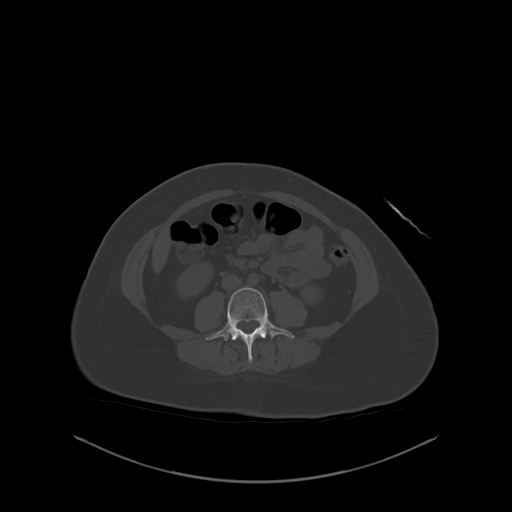
[im 58/85  soft-tissue]
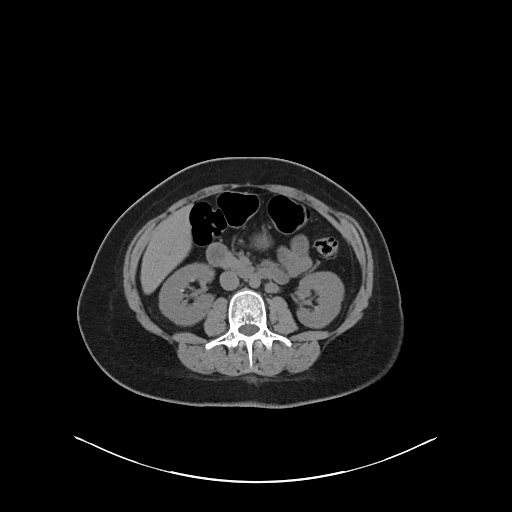
[im 64/85  soft-tissue]
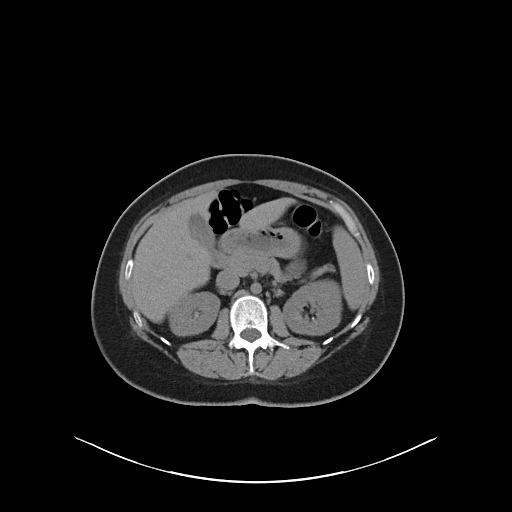
[im 68/85  soft-tissue]
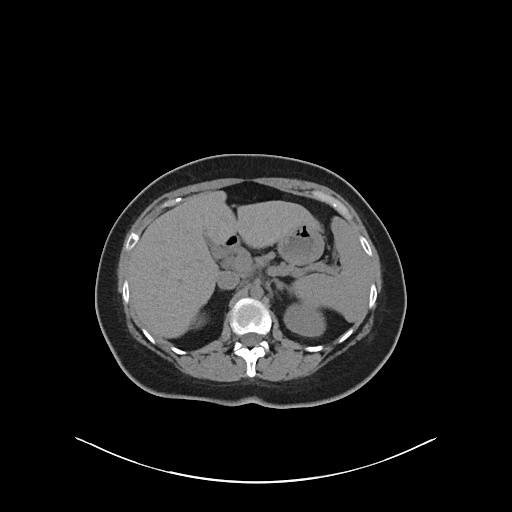
[im 74/85  soft-tissue]
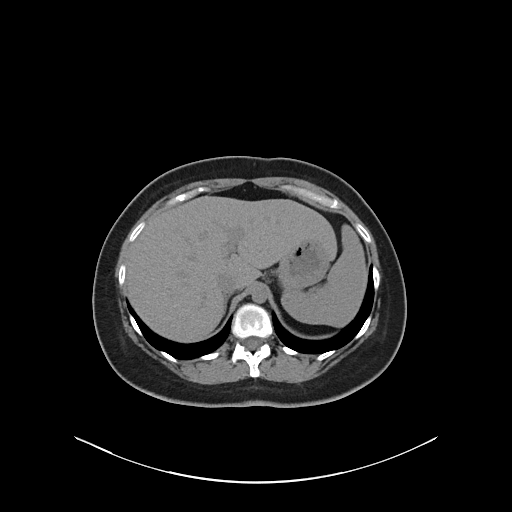
[im 81/85  soft-tissue]
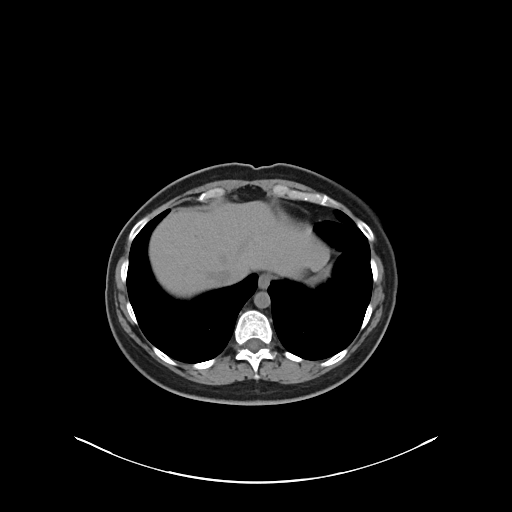

[Series 6: coronal soft tissue · coronal · 0.82mm/px · 3 of 88 slices shown]
[im 30/88  soft-tissue]
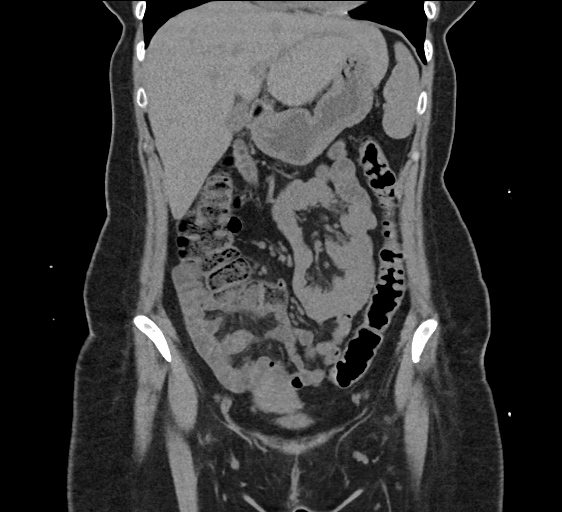
[im 39/88  soft-tissue]
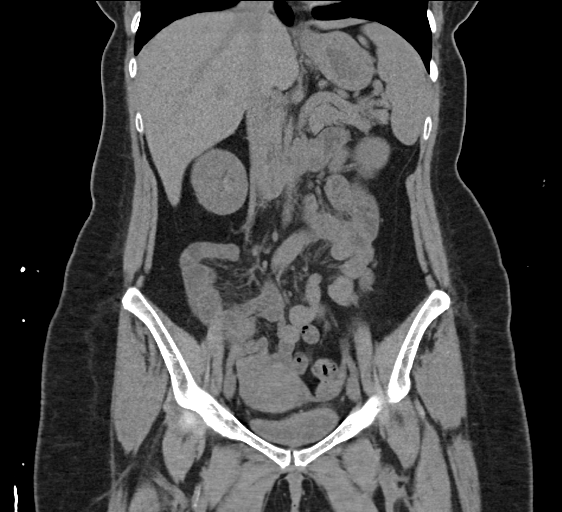
[im 49/88  soft-tissue]
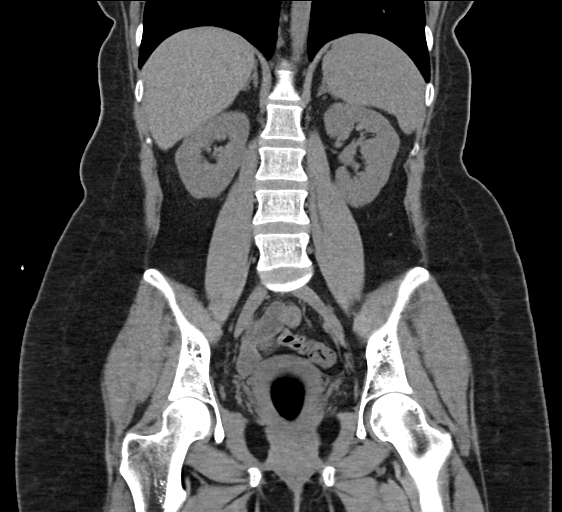

[17 of 46 positions shown; findings below may reference images not displayed]

FINDINGS: Lower chest: No acute abnormality.

Hepatobiliary: No focal liver abnormality is seen. No gallstones,
gallbladder wall thickening, or biliary dilatation.

Pancreas: Unremarkable. No pancreatic ductal dilatation or
surrounding inflammatory changes.

Spleen: Normal in size without focal abnormality.

Adrenals/Urinary Tract: Adrenal glands are within normal limits.
Kidneys demonstrate punctate nonobstructing stones bilaterally. No
obstructive changes are seen. The ureters are within normal limits.
The bladder is decompressed.

Stomach/Bowel: Colon shows no obstructive or inflammatory changes.
The appendix is within normal limits. Small bowel and stomach are
unremarkable.

Vascular/Lymphatic: No significant vascular findings are present. No
enlarged abdominal or pelvic lymph nodes.

Reproductive: Uterus and bilateral adnexa are unremarkable.

Other: No abdominal wall hernia or abnormality. No abdominopelvic
ascites.

Musculoskeletal: No acute or significant osseous findings.
IMPRESSION: Tiny punctate renal stones bilaterally without obstructive change.

No other focal abnormality is noted.

## 2023-03-28 ENCOUNTER — Other Ambulatory Visit: Payer: Self-pay

## 2023-03-28 ENCOUNTER — Emergency Department (HOSPITAL_COMMUNITY)
Admission: EM | Admit: 2023-03-28 | Discharge: 2023-03-28 | Disposition: A | Discharge status: Home / Self Care | Payer: BC Managed Care – PPO | Attending: Emergency Medicine | Admitting: Emergency Medicine

## 2023-03-28 ENCOUNTER — Encounter (HOSPITAL_COMMUNITY): Payer: Self-pay | Admitting: *Deleted

## 2023-03-28 DIAGNOSIS — K047 Periapical abscess without sinus: Secondary | ICD-10-CM | POA: Insufficient documentation

## 2023-03-28 DIAGNOSIS — E119 Type 2 diabetes mellitus without complications: Secondary | ICD-10-CM | POA: Diagnosis not present

## 2023-03-28 DIAGNOSIS — J45909 Unspecified asthma, uncomplicated: Secondary | ICD-10-CM | POA: Diagnosis not present

## 2023-03-28 DIAGNOSIS — K0889 Other specified disorders of teeth and supporting structures: Secondary | ICD-10-CM | POA: Diagnosis present

## 2023-03-28 LAB — COMPREHENSIVE METABOLIC PANEL
ALT: 15 U/L (ref 0–44)
AST: 17 U/L (ref 15–41)
Albumin: 3.7 g/dL (ref 3.5–5.0)
Alkaline Phosphatase: 67 U/L (ref 38–126)
Anion gap: 10 (ref 5–15)
BUN: 12 mg/dL (ref 6–20)
CO2: 23 mmol/L (ref 22–32)
Calcium: 8.6 mg/dL — ABNORMAL LOW (ref 8.9–10.3)
Chloride: 97 mmol/L — ABNORMAL LOW (ref 98–111)
Creatinine, Ser: 0.98 mg/dL (ref 0.44–1.00)
GFR, Estimated: >60 mL/min (ref >60)
Glucose, Bld: 537 mg/dL (ref 70–99)
Potassium: 3.9 mmol/L (ref 3.5–5.1)
Sodium: 130 mmol/L — ABNORMAL LOW (ref 135–145)
Total Bilirubin: 1 mg/dL (ref 0.3–1.2)
Total Protein: 7 g/dL (ref 6.5–8.1)

## 2023-03-28 LAB — CBC WITH DIFFERENTIAL/PLATELET
Abs Immature Granulocytes: 0.03 10*3/uL (ref 0.00–0.07)
Basophils Absolute: 0 10*3/uL (ref 0.0–0.1)
Basophils Relative: 0 %
Eosinophils Absolute: 0 10*3/uL (ref 0.0–0.5)
Eosinophils Relative: 0 %
HCT: 42.4 % (ref 36.0–46.0)
Hemoglobin: 13.8 g/dL (ref 12.0–15.0)
Immature Granulocytes: 0 %
Lymphocytes Relative: 11 %
Lymphs Abs: 1.1 10*3/uL (ref 0.7–4.0)
MCH: 29.2 pg (ref 26.0–34.0)
MCHC: 32.5 g/dL (ref 30.0–36.0)
MCV: 89.6 fL (ref 80.0–100.0)
Monocytes Absolute: 0.5 10*3/uL (ref 0.1–1.0)
Monocytes Relative: 5 %
Neutro Abs: 8.4 10*3/uL — ABNORMAL HIGH (ref 1.7–7.7)
Neutrophils Relative %: 84 %
Platelets: 206 10*3/uL (ref 150–400)
RBC: 4.73 MIL/uL (ref 3.87–5.11)
RDW: 11.9 % (ref 11.5–15.5)
WBC: 10 10*3/uL (ref 4.0–10.5)
nRBC: 0 % (ref 0.0–0.2)

## 2023-03-28 LAB — I-STAT BETA HCG BLOOD, ED (MC, WL, AP ONLY): I-stat hCG, quantitative: <5 m[IU]/mL (ref <5)

## 2023-03-28 LAB — CBG MONITORING, ED: Glucose-Capillary: 201 mg/dL — ABNORMAL HIGH (ref 70–99)

## 2023-03-28 MED ORDER — KETOROLAC TROMETHAMINE 15 MG/ML IJ SOLN
15.0000 mg | Freq: Once | INTRAMUSCULAR | Status: AC
  Administered 2023-03-28: 15 mg via INTRAMUSCULAR
  Filled 2023-03-28: qty 1

## 2023-03-28 MED ORDER — CLINDAMYCIN HCL 150 MG PO CAPS: 450.0000 mg | ORAL_CAPSULE | Freq: Three times a day (TID) | ORAL | 0 refills | Status: DC

## 2023-03-28 MED ORDER — CLINDAMYCIN HCL 150 MG PO CAPS: 450.0000 mg | ORAL_CAPSULE | Freq: Three times a day (TID) | ORAL | 0 refills | Status: AC

## 2023-03-28 MED ORDER — OXYCODONE-ACETAMINOPHEN 5-325 MG PO TABS: 1.0000 | ORAL_TABLET | ORAL | Status: DC | PRN

## 2023-03-28 NOTE — Discharge Instructions (Addendum)
We evaluated you for your tooth pain and facial swelling.  Your examination shows signs of a dental infection.  The antibiotics you are on for your strep throat likely would not cover this type of infection.  I have changed you to an antibiotic called clindamycin.  This should treat strep throat and dental infections.  Please stop taking the cefdinir.  Please take Tylenol and Motrin for your symptoms at home.  You can take 1000 mg of Tylenol every 6 hours and 600 mg of ibuprofen every 6 hours as needed for your symptoms.  You can take these medicines together as needed, either at the same time, or alternating every 3 hours.  Please follow-up closely with a dentist.  I have attached a dental resource guide.  You probably need a root canal or tooth extraction.  Please return if you develop any worsening symptoms such as trouble swallowing, trouble breathing, increasing swelling to the face, fevers or chills, or any other concerning symptoms.

## 2023-03-28 NOTE — ED Triage Notes (Addendum)
Pt states dental pain and jaw swelling to R lower dental pain.  Already on antibiotics for strep.  Pt is a type I diabetic.

## 2023-03-28 NOTE — ED Provider Notes (Signed)
[facility] AT [facility] Provider Note  CSN: [id number] Arrival date & time: 03/28/23 1545  Chief Complaint(s) Dental Pain  HPI Margaret Conner is a 32 y.o. female history of diabetes presenting to the emergency department with right tooth/facial pain.  She reports that she noticed pain to the first bicuspid a few days ago.  Earlier was having some sore throat and diagnosed with strep throat which has improved with treatment with cefdinir.  Patient allergic to penicillins.  She thought that the cefdinir will treat her tooth pain as well.  Her tooth pain has progressed and she also reports some mild swelling to the right side of her face.  Does not have a dentist.  No fevers or chills.  Sore throat is improved.  No trouble swallowing or trouble breathing.   Past Medical History Past Medical History:  Diagnosis Date   Asthma    Diabetes mellitus    Migraine    Patient Active Problem List   Diagnosis Date Noted   Migraine without aura and without status migrainosus, not intractable 02/20/2021   Pyelonephritis 05/24/2019   Diabetes mellitus 02/01/2019   Type 1 diabetes (HCC) 02/01/2019   Home Medication(s) Prior to Admission medications   Medication Sig Start Date End Date Taking? Authorizing Provider  clindamycin (CLEOCIN) 150 MG capsule Take 3 capsules (450 mg total) by mouth 3 (three) times daily for 7 days. 03/28/23 04/04/23 Yes Lonell Grandchild, MD  aspirin EC 81 MG tablet Take 81 mg by mouth daily. Swallow whole.    [provider]  cyclobenzaprine (FLEXERIL) 10 MG tablet Take 10 mg by mouth every 6 (six) hours as needed. 12/18/20   [provider]  ibuprofen (ADVIL) 800 MG tablet Take 1 tablet (800 mg total) by mouth every 8 (eight) hours as needed for moderate pain. Patient not taking: Reported on 02/20/2021 04/03/20   Long, Arlyss Repress, MD  Insulin Human (INSULIN PUMP) SOLN Inject 1 each into the skin continuous. Pump uses  Novolin-R    [provider]  metoCLOPramide (REGLAN) 10 MG tablet Take 1 tablet (10 mg total) by mouth 4 (four) times daily as needed for nausea. 02/20/21   Anson Fret, MD  NIFEdipine (PROCARDIA XL/NIFEDICAL XL) 60 MG 24 hr tablet Take 60 mg by mouth daily.    [provider]  nortriptyline (PAMELOR) 10 MG capsule Take 1 capsule (10 mg total) by mouth at bedtime. 02/20/21   Anson Fret, MD  NOVOLOG 100 UNIT/ML injection SMARTSIG:0-80 Unit(s) SUB-Q Daily 02/13/21   [provider]  omeprazole (PRILOSEC) 20 MG capsule Take 20 mg by mouth daily.    [provider]  ondansetron (ZOFRAN) 4 MG tablet Take 1 tablet (4 mg total) by mouth every 6 (six) hours. 11/25/21   Fayrene Helper, PA-C  oxyCODONE-acetaminophen (PERCOCET/ROXICET) 5-325 MG tablet Take 1 tablet by mouth every 6 (six) hours as needed for severe pain. 07/04/22   Sponseller, Eugene Gavia, PA-C  Prenatal Vit-Fe Fumarate-FA (PRENATAL VITAMIN PO) Take by mouth.    [provider]  propranolol (INDERAL) 20 MG tablet Take 1 tablet (20 mg total) by mouth 2 (two) times daily. Must be seen for further refills. Call [phone] to schedule. 10/28/21   Anson Fret, MD  sertraline (ZOLOFT) 25 MG tablet Take 12.5 mg by mouth daily.    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   Physical Exam Vital Signs  I have reviewed the triage vital signs BP (!) 157/99   Pulse 64   Temp 98.4 F (36.9 C) (Oral)   Resp 18   Ht 5\' 6"  (1.676 m)   Wt 73 kg   LMP 03/07/2023 (Exact Date)   SpO2 100%   BMI 25.98 kg/m  Physical Exam Vitals and nursing note reviewed.  Constitutional:      General: She is not in acute distress.    Appearance: She is well-developed.  HENT:     Head: Normocephalic and atraumatic.     Comments: Very slight right facial swelling with very slight erythema concentrated in the lower right face without induration or fluctuance    Mouth/Throat:     Mouth: Mucous membranes are moist.     Comments: Floor of mouth soft.  Uvula midline.  Tonsils normal.  Poor dentition throughout.  Focal tenderness to percussion of the right first bicuspid of the lower jaw without obvious periapical abscess or swelling. Eyes:     Pupils: Pupils are equal, round, and reactive to light.  Cardiovascular:     Rate and Rhythm: Normal rate and regular rhythm.     Heart sounds: No murmur heard. Pulmonary:     Effort: Pulmonary effort is normal. No respiratory distress.     Breath sounds: Normal breath sounds.  Abdominal:     General: Abdomen is flat.     Palpations: Abdomen is soft.     Tenderness: There is no abdominal tenderness.  Musculoskeletal:        General: No tenderness.     Right lower leg: No edema.     Left lower leg: No edema.  Skin:    General: Skin is warm and dry.  Neurological:     General: No focal deficit present.     Mental Status: She is alert. Mental status is at baseline.  Psychiatric:        Mood and Affect: Mood normal.        Behavior: Behavior normal.     ED Results  and Treatments Labs (all labs ordered are listed, but only abnormal results are displayed) Labs Reviewed  CBC WITH DIFFERENTIAL/PLATELET - Abnormal; Notable for the following components:      Result Value   Neutro Abs 8.4 (*)    All other components within normal limits  COMPREHENSIVE METABOLIC PANEL - Abnormal; Notable for the following components:   Sodium 130 (*)    Chloride 97 (*)  Glucose, Bld 537 (*)    Calcium 8.6 (*)    All other components within normal limits  CBG MONITORING, ED - Abnormal; Notable for the following components:   Glucose-Capillary 201 (*)    All other components within normal limits  I-STAT BETA HCG BLOOD, ED ([facility], [facility], [facility] ONLY)                                                                                                                          Radiology No results found.  Pertinent labs & imaging results that were available during my care of the patient were reviewed by me and considered in my medical decision making (see MDM for details).  Medications Ordered in ED Medications  ketorolac (TORADOL) 15 MG/ML injection 15 mg (15 mg Intramuscular Given 03/28/23 1839)                                                                                                                                     Procedures Procedures  (including critical care time)  Medical Decision Making / ED Course   MDM:  32 year old female presenting to the emergency department with dental pain.  On exam she has possibly some very early facial cellulitis likely originating from dental infection.  She has tenderness to percussion to her affected tooth.  No evidence of periapical abscess which could be drained in the emergency department.  Doubt facial abscess.  Doubt Ludwig's angina or invasive disease.  Suspect cefdinir may not have enough coverage for oral microbial coverage.  Will switch to clindamycin as this may provide more adequate coverage for dental infection and  strep throat although clinically patient does not have signs of strep throat.  Advise close follow-up with a dentist as soon as possible as patient will likely need extraction or root canal.  Labs were obtained in triage by other provider.  Will discharge pending labs.  Clinical Course as of 03/28/23 1854  Sun Mar 28, 2023  1822 CO2: 23 [WS]  1822 Glucose(!!): 537 Glucose is significantly elevated at 537.  Patient reports that she gave herself insulin just prior to labs being drawn given her hyperglycemia.  She reports she is transitioning from insulin pump to injectables and following closely with her endocrinologist.  Will recheck.  If improved, likely discharge.  No evidence of DKA.  CO2 is normal and no anion gap. [WS]  [phone]  Glucose-Capillary(!): 201 Repeat glucose significantly improved.  Will prescribe clindamycin.  Discussed self-care for symptoms.  Advise close dental follow-up.  Will discharge patient to home. All questions answered. Patient comfortable with plan of discharge. Return precautions discussed with patient and specified on the after visit summary. [WS]    Clinical Course User Index [WS] Lonell Grandchild, MD     Additional history obtained: -External records from outside source obtained and reviewed including: Chart review including previous notes, labs, imaging, consultation notes including office visit for sore throat yesterday   Lab Tests: -I ordered, reviewed, and interpreted labs.   The pertinent results include:   Labs Reviewed  CBC WITH DIFFERENTIAL/PLATELET - Abnormal; Notable for the following components:      Result Value   Neutro Abs 8.4 (*)    All other components within normal limits  COMPREHENSIVE METABOLIC PANEL - Abnormal; Notable for the following components:   Sodium 130 (*)    Chloride 97 (*)    Glucose, Bld 537 (*)    Calcium 8.6 (*)    All other components within normal limits  CBG MONITORING, ED - Abnormal; Notable for the following  components:   Glucose-Capillary 201 (*)    All other components within normal limits  I-STAT BETA HCG BLOOD, ED ([facility], [facility], [facility] ONLY)    Notable for hyperglycemia, improved on re-check  Medicines ordered and prescription drug management: Meds ordered this encounter  Medications   DISCONTD: oxyCODONE-acetaminophen (PERCOCET/ROXICET) 5-325 MG per tablet 1 tablet   ketorolac (TORADOL) 15 MG/ML injection 15 mg   clindamycin (CLEOCIN) 150 MG capsule    Sig: Take 3 capsules (450 mg total) by mouth 3 (three) times daily for 7 days.    Dispense:  63 capsule    Refill:  0    -I have reviewed the patients home medicines and have made adjustments as needed   Reevaluation: After the interventions noted above, I reevaluated the patient and found that their symptoms have improved  Co morbidities that complicate the patient evaluation  Past Medical History:  Diagnosis Date   Asthma    Diabetes mellitus    Migraine       Dispostion: Disposition decision including need for hospitalization was considered, and patient discharged from emergency department.    Final Clinical Impression(s) / ED Diagnoses Final diagnoses:  Dental infection     This chart was dictated using voice recognition software.  Despite best efforts to proofread,  errors can occur which can change the documentation meaning.    Lonell Grandchild, MD 03/28/23 [phone]
